# Patient Record
Sex: Female | Born: 1955
Health system: Southern US, Community
[De-identification: ages and names within clinical notes are randomized; demographics above are authoritative.]

## PROBLEM LIST (undated history)

## (undated) DIAGNOSIS — B019 Varicella without complication: Secondary | ICD-10-CM

## (undated) DIAGNOSIS — I10 Essential (primary) hypertension: Secondary | ICD-10-CM

## (undated) DIAGNOSIS — E785 Hyperlipidemia, unspecified: Secondary | ICD-10-CM

## (undated) DIAGNOSIS — K759 Inflammatory liver disease, unspecified: Secondary | ICD-10-CM

## (undated) HISTORY — DX: Varicella without complication: B01.9

## (undated) HISTORY — DX: Inflammatory liver disease, unspecified: K75.9

## (undated) HISTORY — PX: OTHER SURGICAL HISTORY: SHX169

## (undated) HISTORY — DX: Essential (primary) hypertension: I10

## (undated) HISTORY — DX: Hyperlipidemia, unspecified: E78.5

---

## 1988-12-20 HISTORY — PX: CHOLECYSTECTOMY: SHX55

## 1988-12-20 HISTORY — PX: ABDOMINAL HYSTERECTOMY: SHX81

## 1994-12-20 HISTORY — PX: BREAST REDUCTION SURGERY: SHX8

## 1998-07-22 ENCOUNTER — Inpatient Hospital Stay (HOSPITAL_COMMUNITY): Admission: RE | Admit: 1998-07-22 | Discharge: 1998-07-24 | Payer: Self-pay | Admitting: Obstetrics and Gynecology

## 1999-08-26 ENCOUNTER — Other Ambulatory Visit: Admission: RE | Admit: 1999-08-26 | Discharge: 1999-08-26 | Payer: Self-pay | Admitting: Obstetrics and Gynecology

## 1999-09-29 ENCOUNTER — Encounter: Admission: RE | Admit: 1999-09-29 | Discharge: 1999-09-29 | Payer: Self-pay | Admitting: Obstetrics and Gynecology

## 2000-09-21 ENCOUNTER — Other Ambulatory Visit: Admission: RE | Admit: 2000-09-21 | Discharge: 2000-09-21 | Payer: Self-pay | Admitting: Obstetrics and Gynecology

## 2000-09-23 ENCOUNTER — Encounter: Admission: RE | Admit: 2000-09-23 | Discharge: 2000-09-23 | Payer: Self-pay | Admitting: Obstetrics and Gynecology

## 2000-09-23 ENCOUNTER — Encounter: Payer: Self-pay | Admitting: Obstetrics and Gynecology

## 2001-09-25 ENCOUNTER — Other Ambulatory Visit: Admission: RE | Admit: 2001-09-25 | Discharge: 2001-09-25 | Payer: Self-pay | Admitting: Obstetrics and Gynecology

## 2001-09-26 ENCOUNTER — Encounter: Admission: RE | Admit: 2001-09-26 | Discharge: 2001-09-26 | Payer: Self-pay | Admitting: Obstetrics and Gynecology

## 2001-09-26 ENCOUNTER — Encounter: Payer: Self-pay | Admitting: Obstetrics and Gynecology

## 2002-09-27 ENCOUNTER — Encounter: Admission: RE | Admit: 2002-09-27 | Discharge: 2002-09-27 | Payer: Self-pay | Admitting: Obstetrics and Gynecology

## 2002-09-27 ENCOUNTER — Encounter: Payer: Self-pay | Admitting: Obstetrics and Gynecology

## 2003-10-07 ENCOUNTER — Encounter: Admission: RE | Admit: 2003-10-07 | Discharge: 2003-10-07 | Payer: Self-pay | Admitting: Obstetrics and Gynecology

## 2003-10-07 ENCOUNTER — Encounter: Payer: Self-pay | Admitting: Obstetrics and Gynecology

## 2004-10-08 ENCOUNTER — Encounter: Admission: RE | Admit: 2004-10-08 | Discharge: 2004-10-08 | Payer: Self-pay | Admitting: Obstetrics and Gynecology

## 2005-07-30 ENCOUNTER — Encounter: Admission: RE | Admit: 2005-07-30 | Discharge: 2005-07-30 | Payer: Self-pay | Admitting: Obstetrics and Gynecology

## 2006-08-11 ENCOUNTER — Encounter: Admission: RE | Admit: 2006-08-11 | Discharge: 2006-08-11 | Payer: Self-pay | Admitting: Obstetrics and Gynecology

## 2007-08-25 ENCOUNTER — Encounter: Admission: RE | Admit: 2007-08-25 | Discharge: 2007-08-25 | Payer: Self-pay | Admitting: Obstetrics and Gynecology

## 2008-08-28 ENCOUNTER — Encounter: Admission: RE | Admit: 2008-08-28 | Discharge: 2008-08-28 | Payer: Self-pay | Admitting: Obstetrics and Gynecology

## 2009-08-29 ENCOUNTER — Encounter: Admission: RE | Admit: 2009-08-29 | Discharge: 2009-08-29 | Payer: Self-pay | Admitting: Obstetrics and Gynecology

## 2009-12-24 LAB — HM MAMMOGRAPHY: HM Mammogram: NEGATIVE

## 2010-09-03 ENCOUNTER — Encounter: Admission: RE | Admit: 2010-09-03 | Discharge: 2010-09-03 | Payer: Self-pay | Admitting: Obstetrics and Gynecology

## 2011-06-18 ENCOUNTER — Ambulatory Visit: Payer: Self-pay | Admitting: Family Medicine

## 2011-06-24 ENCOUNTER — Ambulatory Visit (INDEPENDENT_AMBULATORY_CARE_PROVIDER_SITE_OTHER): Payer: 59 | Admitting: Family Medicine

## 2011-06-24 ENCOUNTER — Encounter: Payer: Self-pay | Admitting: Family Medicine

## 2011-06-24 DIAGNOSIS — E785 Hyperlipidemia, unspecified: Secondary | ICD-10-CM | POA: Insufficient documentation

## 2011-06-24 DIAGNOSIS — E119 Type 2 diabetes mellitus without complications: Secondary | ICD-10-CM

## 2011-06-24 DIAGNOSIS — I10 Essential (primary) hypertension: Secondary | ICD-10-CM

## 2011-06-24 LAB — BASIC METABOLIC PANEL
BUN: 11 mg/dL (ref 6–23)
Calcium: 9.5 mg/dL (ref 8.4–10.5)
Creatinine, Ser: 0.6 mg/dL (ref 0.4–1.2)
GFR: 108.35 mL/min (ref >60.00)

## 2011-06-24 LAB — HEPATIC FUNCTION PANEL
ALT: 29 U/L (ref 0–35)
Alkaline Phosphatase: 77 U/L (ref 39–117)
Bilirubin, Direct: 0.1 mg/dL (ref 0.0–0.3)
Total Protein: 7.1 g/dL (ref 6.0–8.3)

## 2011-06-24 LAB — LIPID PANEL
Cholesterol: 190 mg/dL (ref 0–200)
LDL Cholesterol: 106 mg/dL — ABNORMAL HIGH (ref 0–99)

## 2011-06-24 NOTE — Progress Notes (Signed)
  Subjective:    Patient ID: Amanda Obrien, female    DOB: September 18, 1956, 55 y.o.   MRN: [id number]  HPI New patient to establish care. Chronic problems include type 2 diabetes, hypertension, hyperlipidemia. She has had diabetes since the 1990s.  Unknown level of control. Does not monitor blood sugars regularly. No symptoms of hyperglycemia. Last A1c over 6 months ago. Does receive yearly eye exams. Takes metformin 500 mg twice daily  Hyperlipidemia treated with simvastatin 20 mg daily. No history of CAD. No chest pain.  History hypertension treated Micardis-HCTZ.  No recent dizziness or orthostasis.  Surgical history cholecystectomy 1990s and hysterectomy 1990s. History breast reduction 1996  Family history significant for sister with breast cancer. Father had question of lymphoma and bladder cancer metastatic to lung. Mother with history of hyperlipidemia, hypertension, and type 2 diabetes.  Patient is married. Works as a Sports administrator. Nonsmoker. No alcohol use. She sees a gynecologist yearly for physical  Past Medical History  Diagnosis Date  . Chicken pox   . Hypertension   . Hyperlipidemia   . Hepatitis   . Diabetes mellitus 1998    type ll   Past Surgical History  Procedure Date  . Cholecystectomy 1990  . Abdominal hysterectomy 1990  . Breast reduction surgery 1996    reports that she has never smoked. She does not have any smokeless tobacco history on file. Her alcohol and drug histories not on file. family history includes Arthritis in her mother; Cancer in her sister; Cancer (age of onset:52) in her father; Diabetes in her maternal grandfather and mother; Hyperlipidemia in her mother; Hypertension in her mother; and Stroke in her maternal grandmother. No Known Allergies    Review of Systems  Constitutional: Negative for fever, chills, appetite change and fatigue.  HENT: Negative for trouble swallowing.   Eyes: Negative for visual disturbance.  Respiratory: Negative  for cough and shortness of breath.   Cardiovascular: Negative for chest pain, palpitations and leg swelling.  Gastrointestinal: Negative for abdominal pain and anal bleeding.  Genitourinary: Negative for dysuria.  Neurological: Negative for dizziness and weakness.  Hematological: Negative for adenopathy.  Psychiatric/Behavioral: Negative for dysphoric mood.       Objective:   Physical Exam  Constitutional: She is oriented to person, place, and time. She appears well-developed and well-nourished. No distress.  HENT:  Right Ear: External ear normal.  Left Ear: External ear normal.  Mouth/Throat: Oropharynx is clear and moist. No oropharyngeal exudate.  Neck: Neck supple. No thyromegaly present.  Cardiovascular: Normal rate, regular rhythm and normal heart sounds.   No murmur heard. Pulmonary/Chest: Effort normal and breath sounds normal. No respiratory distress. She has no wheezes. She has no rales.  Musculoskeletal: She exhibits no edema.  Lymphadenopathy:    She has no cervical adenopathy.  Neurological: She is alert and oriented to person, place, and time. No cranial nerve deficit.  Skin: No rash noted.  Psychiatric: She has a normal mood and affect. Her behavior is normal.          Assessment & Plan:  #1 type 2 diabetes. Reassess A1c. Continue yearly eye exam. #2 hyperlipidemia. Check lipids and hepatic panel  #3 hypertension stable continue current medication. Check basic metabolic panel. Discussed regular exercise

## 2011-06-25 ENCOUNTER — Other Ambulatory Visit: Payer: Self-pay | Admitting: *Deleted

## 2011-06-25 MED ORDER — SIMVASTATIN 20 MG PO TABS: 20.0000 mg | ORAL_TABLET | Freq: Every day | ORAL | Status: DC

## 2011-06-25 MED ORDER — TELMISARTAN-HCTZ 80-25 MG PO TABS: ORAL_TABLET | ORAL | Status: DC

## 2011-06-25 MED ORDER — METFORMIN HCL 1000 MG PO TABS: 500.0000 mg | ORAL_TABLET | Freq: Every day | ORAL | Status: DC

## 2011-06-25 NOTE — Progress Notes (Signed)
Quick Note:  Pt informed, she requested all her meds be refilled ______

## 2011-06-28 ENCOUNTER — Telehealth: Payer: Self-pay | Admitting: Family Medicine

## 2011-06-28 NOTE — Telephone Encounter (Signed)
Pt informed on VM this med was filled on 7/6 #90 with refills, so check with [facility]

## 2011-06-28 NOTE — Telephone Encounter (Signed)
On Metformin 1000mg  1qd. She only received 15 tabs form her pharmacy. She cuts those in half. So she is requesting 30 tablets per month. Please advise pt.

## 2011-07-09 ENCOUNTER — Telehealth: Payer: Self-pay | Admitting: Family Medicine

## 2011-07-09 NOTE — Telephone Encounter (Signed)
Amanda Obrien at pharmacy reports they did receive the refill on 7/6 #90 with 2 refills.  Perhaps Amanda Obrien insurance is the problem.  Informed pt on home phone Rx had been received at [facility]

## 2011-07-09 NOTE — Telephone Encounter (Signed)
Pt. Spoke with [facility], they did not receive prescription. Pt requesting to resend.     Braxton Feathers, LPN 12/25/1094 0:45 PM Signed  Pt informed on VM this med was filled on 7/6 #90 with refills, so check [facility] 06/28/2011 9:55 AM Signed  On Metformin 1000mg  1qd. She only received 15 tabs form her pharmacy. She cuts those in half. So she is requesting 30 tablets per month. Please advise pt.

## 2011-07-12 ENCOUNTER — Telehealth: Payer: Self-pay | Admitting: Family Medicine

## 2011-07-12 MED ORDER — METFORMIN HCL 1000 MG PO TABS: ORAL_TABLET | ORAL | Status: DC

## 2011-07-12 NOTE — Telephone Encounter (Signed)
Pt called and said that originally she was prescribed 1000 mg metformin and she was cutting pill in half and taking 500 mg in a.m and the other 500 mg at night. Pt is wondering if she now is suppose to be taking 500 mg once a day? Pls call to clarify and contact [facility] with dosage clarification.

## 2011-07-12 NOTE — Telephone Encounter (Signed)
Pt came to [place] as a new pt, has been taking metformin 1000 mg, taking 1/2 tab 2 times daily.   Correction made to chart and sent to pharmacy with new sig.  Pt aware

## 2011-08-26 ENCOUNTER — Other Ambulatory Visit: Payer: Self-pay | Admitting: Obstetrics and Gynecology

## 2011-08-26 DIAGNOSIS — Z1231 Encounter for screening mammogram for malignant neoplasm of breast: Secondary | ICD-10-CM

## 2011-09-16 ENCOUNTER — Ambulatory Visit
Admission: RE | Admit: 2011-09-16 | Discharge: 2011-09-16 | Disposition: A | Discharge status: Home / Self Care | Payer: 59 | Source: Ambulatory Visit | Attending: Obstetrics and Gynecology | Admitting: Obstetrics and Gynecology

## 2011-09-16 DIAGNOSIS — Z1231 Encounter for screening mammogram for malignant neoplasm of breast: Secondary | ICD-10-CM

## 2011-10-07 ENCOUNTER — Encounter: Payer: Self-pay | Admitting: Family Medicine

## 2011-10-07 ENCOUNTER — Ambulatory Visit (INDEPENDENT_AMBULATORY_CARE_PROVIDER_SITE_OTHER): Payer: 59 | Admitting: Family Medicine

## 2011-10-07 DIAGNOSIS — E119 Type 2 diabetes mellitus without complications: Secondary | ICD-10-CM

## 2011-10-07 DIAGNOSIS — I10 Essential (primary) hypertension: Secondary | ICD-10-CM

## 2011-10-07 MED ORDER — TELMISARTAN-HCTZ 40-12.5 MG PO TABS: 1.0000 | ORAL_TABLET | Freq: Every day | ORAL | Status: DC

## 2011-10-07 NOTE — Progress Notes (Signed)
  Subjective:    Patient ID: Amanda Obrien, female    DOB: [birth year], 55 y.o.   MRN: [id number]  HPI Patient seen for medical followup. Type 2 diabetes. Mostly below 100 blood sugars. Takes metformin. Has lost about 16 pounds with Weight Watchers since last visit. Feels better overall. Was having some dizziness and she reduced her blood pressure medication to one fourth of her depression medication. No orthostasis at this time. No hypoglycemia. Not exercising regularly.   Review of Systems  Constitutional: Negative for fever, chills, appetite change, fatigue and unexpected weight change.  Respiratory: Negative for cough and shortness of breath.   Cardiovascular: Negative for chest pain.  Neurological: Negative for dizziness and headaches.       Objective:   Physical Exam  Constitutional: She appears well-developed and well-nourished.  Neck: Neck supple. No thyromegaly present.  Cardiovascular: Normal rate and regular rhythm.   Pulmonary/Chest: Effort normal and breath sounds normal. No respiratory distress. She has no wheezes. She has no rales.  Musculoskeletal: She exhibits no edema.  Lymphadenopathy:    She has no cervical adenopathy.          Assessment & Plan:  #1 diabetes. Type II. History of good control. Recheck A1c. Good every 6 months if well controlled today #2 hypertension stable produce mICARDIS HCTZ to 40/12.5 one daily

## 2011-10-13 NOTE — Progress Notes (Signed)
Quick Note:  Pt informed on home VM ______ 

## 2012-01-13 LAB — HM DIABETES EYE EXAM: HM Diabetic Eye Exam: NORMAL

## 2012-02-14 ENCOUNTER — Ambulatory Visit (INDEPENDENT_AMBULATORY_CARE_PROVIDER_SITE_OTHER): Payer: 59 | Admitting: Family Medicine

## 2012-02-14 ENCOUNTER — Encounter: Payer: Self-pay | Admitting: Family Medicine

## 2012-02-14 DIAGNOSIS — J019 Acute sinusitis, unspecified: Secondary | ICD-10-CM

## 2012-02-14 MED ORDER — AMOXICILLIN 875 MG PO TABS: 875.0000 mg | ORAL_TABLET | Freq: Two times a day (BID) | ORAL | Status: AC

## 2012-02-14 NOTE — Patient Instructions (Signed)

## 2012-02-14 NOTE — Progress Notes (Signed)
  Subjective:    Patient ID: Amanda Obrien, female    DOB: Jul 15, 1956, 56 y.o.   MRN: [id number]  HPI  Acute visit. Of one week history of progressive maxillary facial pain right greater than left. She's had some colored nasal discharge. Increased fatigue. Mild body aches. Intermittent headaches. Intermittent sore throat. Minimal cough. She's tried increase fluid and nasal irrigation along with over-the-counter decongestants without any improvement.   Review of Systems  Constitutional: Positive for fatigue. Negative for fever and chills.  HENT: Positive for congestion, sore throat, postnasal drip and sinus pressure.   Respiratory: Negative for cough.   Neurological: Positive for headaches.       Objective:   Physical Exam  Constitutional: She appears well-developed and well-nourished.  HENT:  Right Ear: External ear normal.  Left Ear: External ear normal.  Mouth/Throat: Oropharynx is clear and moist.  Neck: Neck supple.  Cardiovascular: Normal rate and regular rhythm.   Pulmonary/Chest: Effort normal and breath sounds normal. No respiratory distress. She has no wheezes. She has no rales.  Lymphadenopathy:    She has no cervical adenopathy.          Assessment & Plan:  Acute sinusitis. Question viral. Recommend observe for another couple days. If symptoms persist or worse in the meantime start amoxicillin 875 mg twice daily for 10 days.

## 2012-02-17 ENCOUNTER — Encounter: Payer: Self-pay | Admitting: Family Medicine

## 2012-03-27 ENCOUNTER — Other Ambulatory Visit: Payer: Self-pay | Admitting: Family Medicine

## 2012-04-06 ENCOUNTER — Ambulatory Visit (INDEPENDENT_AMBULATORY_CARE_PROVIDER_SITE_OTHER): Payer: 59 | Admitting: Family Medicine

## 2012-04-06 ENCOUNTER — Encounter: Payer: Self-pay | Admitting: Family Medicine

## 2012-04-06 VITALS — BP 110/80 | Temp 97.6°F | Wt 167.0 lb

## 2012-04-06 DIAGNOSIS — E119 Type 2 diabetes mellitus without complications: Secondary | ICD-10-CM

## 2012-04-06 DIAGNOSIS — Z0000 Encounter for general adult medical examination without abnormal findings: Secondary | ICD-10-CM

## 2012-04-06 DIAGNOSIS — E785 Hyperlipidemia, unspecified: Secondary | ICD-10-CM

## 2012-04-06 DIAGNOSIS — I10 Essential (primary) hypertension: Secondary | ICD-10-CM

## 2012-04-06 LAB — LIPID PANEL
HDL: 49.3 mg/dL (ref >39.00)
LDL Cholesterol: 85 mg/dL (ref 0–99)
Total CHOL/HDL Ratio: 3
Triglycerides: 127 mg/dL (ref 0.0–149.0)

## 2012-04-06 LAB — HEPATIC FUNCTION PANEL
Albumin: 4.5 g/dL (ref 3.5–5.2)
Alkaline Phosphatase: 73 U/L (ref 39–117)

## 2012-04-06 LAB — BASIC METABOLIC PANEL
CO2: 27 mEq/L (ref 19–32)
Chloride: 104 mEq/L (ref 96–112)
Creatinine, Ser: 0.6 mg/dL (ref 0.4–1.2)

## 2012-04-06 LAB — HEMOGLOBIN A1C: Hgb A1c MFr Bld: 6.2 % (ref 4.6–6.5)

## 2012-04-06 MED ORDER — METFORMIN HCL 1000 MG PO TABS: ORAL_TABLET | ORAL | Status: DC

## 2012-04-06 MED ORDER — TELMISARTAN-HCTZ 40-12.5 MG PO TABS: ORAL_TABLET | ORAL | Status: DC

## 2012-04-06 MED ORDER — SIMVASTATIN 20 MG PO TABS: 20.0000 mg | ORAL_TABLET | Freq: Every day | ORAL | Status: DC

## 2012-04-06 NOTE — Progress Notes (Signed)
  Subjective:    Patient ID: Amanda Obrien, female    DOB: [birth year], 56 y.o.   MRN: [id number]  HPI  Medical followup. Patient has history of type 2 diabetes, hypertension, and hyperlipidemia. Recent poor compliance with diet. She has gained back some weight since last visit. Last A1c 5.8%. Takes metformin 1000 mg one half tablet twice daily. Hypertension treated with Micardis- HCTZ. Hyperlipidemia treated with Zocor.  Compliant with medications. No side effects. No regular exercise. Receives yearly eye exams. No neuropathy symptoms. Patient is nonsmoker.  Past Medical History  Diagnosis Date  . Chicken pox   . Hypertension   . Hyperlipidemia   . Hepatitis   . Diabetes mellitus 1998    type ll   Past Surgical History  Procedure Date  . Cholecystectomy 1990  . Abdominal hysterectomy 1990  . Breast reduction surgery 1996    reports that she has never smoked. She does not have any smokeless tobacco history on file. Her alcohol and drug histories not on file. family history includes Arthritis in her mother; Cancer in her sister; Cancer (age of onset:52) in her father; Diabetes in her maternal grandfather and mother; Hyperlipidemia in her mother; Hypertension in her mother; and Stroke in her maternal grandmother. No Known Allergies    Review of Systems  Constitutional: Negative for fatigue.  Eyes: Negative for visual disturbance.  Respiratory: Negative for cough, chest tightness, shortness of breath and wheezing.   Cardiovascular: Negative for chest pain, palpitations and leg swelling.  Neurological: Negative for dizziness, seizures, syncope, weakness, light-headedness and headaches.       Objective:   Physical Exam  Constitutional: She appears well-developed and well-nourished.  Neck: Neck supple. No thyromegaly present.  Cardiovascular: Normal rate and regular rhythm.   Pulmonary/Chest: Effort normal and breath sounds normal. No respiratory distress. She has no wheezes. She  has no rales.  Musculoskeletal: She exhibits no edema.       Feet reveal no skin lesions. Good distal foot pulses. Good capillary refill. No calluses. Normal sensation with monofilament testing   Lymphadenopathy:    She has no cervical adenopathy.  Psychiatric: She has a normal mood and affect. Her behavior is normal.          Assessment & Plan:  #1 hypertension. Stable. Check basic metabolic panel. Work on weight loss #2 type 2 diabetes. Recheck A1c. Encouraged regular exercise and weight loss  #3 hyperlipidemia. Stable on simvastatin. Recheck lipids and hepatic panel #4 health maintenance. Patient has never had screening colonoscopy. She agrees at this time. She is asymptomatic. No family history of colon cancer.

## 2012-04-07 NOTE — Progress Notes (Signed)
Quick Note:  Pt informed on home VM ______ 

## 2012-04-10 ENCOUNTER — Encounter: Payer: Self-pay | Admitting: Internal Medicine

## 2012-05-19 ENCOUNTER — Encounter: Payer: Self-pay | Admitting: Internal Medicine

## 2012-05-19 ENCOUNTER — Ambulatory Visit (AMBULATORY_SURGERY_CENTER): Payer: 59 | Admitting: *Deleted

## 2012-05-19 VITALS — Ht 64.0 in | Wt 168.7 lb

## 2012-05-19 DIAGNOSIS — Z1211 Encounter for screening for malignant neoplasm of colon: Secondary | ICD-10-CM

## 2012-05-19 MED ORDER — PEG-KCL-NACL-NASULF-NA ASC-C 100 G PO SOLR: ORAL | Status: DC

## 2012-06-02 ENCOUNTER — Ambulatory Visit (AMBULATORY_SURGERY_CENTER): Payer: 59 | Admitting: Internal Medicine

## 2012-06-02 ENCOUNTER — Encounter: Payer: Self-pay | Admitting: Internal Medicine

## 2012-06-02 VITALS — BP 140/105 | HR 92 | Temp 98.7°F | Resp 18 | Ht 64.0 in | Wt 168.0 lb

## 2012-06-02 DIAGNOSIS — Z1211 Encounter for screening for malignant neoplasm of colon: Secondary | ICD-10-CM

## 2012-06-02 MED ORDER — SODIUM CHLORIDE 0.9 % IV SOLN: 500.0000 mL | INTRAVENOUS | Status: DC

## 2012-06-02 NOTE — Progress Notes (Signed)
Patient did not experience any of the following events: a burn prior to discharge; a fall within the facility; wrong site/side/patient/procedure/implant event; or a hospital transfer or hospital admission upon discharge from the facility. (G8907) Patient did not have preoperative order for IV antibiotic SSI prophylaxis. (G8918)  

## 2012-06-02 NOTE — Op Note (Signed)
[facility] [address] [place], [place]  [address]  COLONOSCOPY PROCEDURE REPORT  PATIENT:  Amanda Obrien, Amanda Obrien  MR#:  [id number] BIRTHDATE:  1956-03-10, 55 yrs. old  GENDER:  female ENDOSCOPIST:  Hedwig Morton. Juanda Chance, MD REF. BY:  Evelena Peat, M.D. PROCEDURE DATE:  06/02/2012 PROCEDURE:  Colonoscopy 19147 ASA CLASS:  Class II INDICATIONS:  colorectal cancer screening, average risk MEDICATIONS:   MAC sedation, administered by CRNA, propofol (Diprivan) 200 mg  DESCRIPTION OF PROCEDURE:   After the risks and benefits and of the procedure were explained, informed consent was obtained. Digital rectal exam was performed and revealed no rectal masses. The LB CF-[id number] endoscope was introduced through the anus and advanced to the cecum, which was identified by both the appendix and ileocecal valve.  The quality of the prep was good, using MoviPrep.  The instrument was then slowly withdrawn as the colon was fully examined. <<PROCEDUREIMAGES>>  FINDINGS:  No polyps or cancers were seen (see image3, image4, image5, image6, and image7).  Scattered diverticula were found (see image1).   Retroflexed views in the rectum revealed no abnormalities.    The scope was then withdrawn from the patient and the procedure completed.  COMPLICATIONS:  None ENDOSCOPIC IMPRESSION: 1) No polyps or cancers 2) Diverticula, scattered RECOMMENDATIONS: 1) High fiber diet.  REPEAT EXAM:  In 10 year(s) for.  ______________________________ Hedwig Morton. Juanda Chance, MD  CC:  n. eSIGNED:   Hedwig Morton. Mercedees Convery at 06/02/2012 10:09 AM  Huey Bienenstock, [phone]

## 2012-06-02 NOTE — Patient Instructions (Addendum)

## 2012-06-05 ENCOUNTER — Telehealth: Payer: Self-pay | Admitting: *Deleted

## 2012-06-05 NOTE — Telephone Encounter (Signed)
  Follow up Call-  Call back number 06/02/2012  Post procedure Call Back phone  # [phone]  Permission to leave phone message Yes     Patient questions:  Do you have a fever, pain , or abdominal swelling? no Pain Score  0 *  Have you tolerated food without any problems? yes  Have you been able to return to your normal activities? yes  Do you have any questions about your discharge instructions: Diet   no Medications  no Follow up visit  no  Do you have questions or concerns about your Care? no  Actions: * If pain score is 4 or above: No action needed, pain <4.

## 2012-08-24 ENCOUNTER — Other Ambulatory Visit: Payer: Self-pay | Admitting: Obstetrics and Gynecology

## 2012-08-24 DIAGNOSIS — Z1231 Encounter for screening mammogram for malignant neoplasm of breast: Secondary | ICD-10-CM

## 2012-09-21 ENCOUNTER — Ambulatory Visit: Payer: 59

## 2012-09-28 ENCOUNTER — Ambulatory Visit
Admission: RE | Admit: 2012-09-28 | Discharge: 2012-09-28 | Disposition: A | Discharge status: Home / Self Care | Payer: 59 | Source: Ambulatory Visit | Attending: Obstetrics and Gynecology | Admitting: Obstetrics and Gynecology

## 2012-09-28 DIAGNOSIS — Z1231 Encounter for screening mammogram for malignant neoplasm of breast: Secondary | ICD-10-CM

## 2012-10-06 ENCOUNTER — Ambulatory Visit: Payer: 59 | Admitting: Family Medicine

## 2012-10-12 ENCOUNTER — Ambulatory Visit (INDEPENDENT_AMBULATORY_CARE_PROVIDER_SITE_OTHER): Payer: 59 | Admitting: Family Medicine

## 2012-10-12 ENCOUNTER — Encounter: Payer: Self-pay | Admitting: Family Medicine

## 2012-10-12 ENCOUNTER — Telehealth: Payer: Self-pay | Admitting: *Deleted

## 2012-10-12 VITALS — BP 142/80 | Temp 97.6°F | Wt 176.0 lb

## 2012-10-12 DIAGNOSIS — E119 Type 2 diabetes mellitus without complications: Secondary | ICD-10-CM

## 2012-10-12 DIAGNOSIS — Z23 Encounter for immunization: Secondary | ICD-10-CM

## 2012-10-12 DIAGNOSIS — I10 Essential (primary) hypertension: Secondary | ICD-10-CM

## 2012-10-12 LAB — HEMOGLOBIN A1C: Hgb A1c MFr Bld: 6.6 % — ABNORMAL HIGH (ref 4.6–6.5)

## 2012-10-12 NOTE — Progress Notes (Signed)
  Subjective:    Patient ID: Amanda Obrien, female    DOB: August 07, 1956, 56 y.o.   MRN: [id number]  HPI  Followup for diabetes, hypertension, and hyperlipidemia. She's under stress of mother with terminal illness with leukemia. Poor dietary compliance. Not exercising. Has gained 9 pounds since last visit. Not monitoring blood pressure regularly. Diabetes slightly elevated. Last A1c 6.2%. Remains on metformin 500 mg twice daily. Lipids well controlled last visit. No dizziness. No headaches. No chest pains. Nonsmoker.  Past Medical History  Diagnosis Date  . Chicken pox   . Hypertension   . Hyperlipidemia   . Hepatitis   . Diabetes mellitus 1998    type ll   Past Surgical History  Procedure Date  . Cholecystectomy 1990  . Abdominal hysterectomy 1990  . Breast reduction surgery 1996    reports that she has never smoked. She has never used smokeless tobacco. She reports that she does not drink alcohol or use illicit drugs. family history includes Arthritis in her mother; Cancer in her sister; Cancer (age of onset:52) in her father; Diabetes in her maternal grandfather and mother; Hyperlipidemia in her mother; Hypertension in her mother; and Stroke in her maternal grandmother.  There is no history of Colon cancer and Stomach cancer. No Known Allergies   Review of Systems  Constitutional: Negative for chills, appetite change and unexpected weight change.  Respiratory: Negative for cough and shortness of breath.   Cardiovascular: Negative for chest pain.  Neurological: Negative for dizziness and syncope.       Objective:   Physical Exam  Constitutional: She is oriented to person, place, and time. She appears well-developed and well-nourished. No distress.  Neck: Neck supple. No thyromegaly present.  Cardiovascular: Normal rate and regular rhythm.   Pulmonary/Chest: Effort normal and breath sounds normal. No respiratory distress. She has no wheezes. She has no rales.  Musculoskeletal:  She exhibits no edema.  Lymphadenopathy:    She has no cervical adenopathy.  Neurological: She is alert and oriented to person, place, and time.          Assessment & Plan:  #1Type 2 diabetes. Prior history of good control but recent poor compliance and suspect A1c will be up somewhat.  Discussed diet and exercise. Recheck A1c. She has eye exam scheduled for December #2 hypertension. Slightly elevated today. Work on weight loss and be in touch if not consistently less than 140/90 by home readings  #3 health maintenance. Flu vaccine given

## 2012-10-12 NOTE — Patient Instructions (Signed)
Work on losing some weight and monitor blood pressure and be in touch if consistently > 140/90.

## 2012-10-13 ENCOUNTER — Other Ambulatory Visit: Payer: Self-pay | Admitting: *Deleted

## 2012-10-13 MED ORDER — TELMISARTAN-HCTZ 40-12.5 MG PO TABS: ORAL_TABLET | ORAL | Status: DC

## 2012-10-13 MED ORDER — METFORMIN HCL 1000 MG PO TABS: ORAL_TABLET | ORAL | Status: DC

## 2012-10-13 MED ORDER — SIMVASTATIN 20 MG PO TABS: 20.0000 mg | ORAL_TABLET | Freq: Every day | ORAL | Status: DC

## 2012-10-13 NOTE — Progress Notes (Signed)
Quick Note:  Pt informed ______ 

## 2012-10-16 NOTE — Telephone Encounter (Signed)
Refill ordered

## 2012-11-04 ENCOUNTER — Other Ambulatory Visit: Payer: Self-pay | Admitting: Family Medicine

## 2013-01-11 LAB — HM DIABETES EYE EXAM

## 2013-01-25 ENCOUNTER — Encounter: Payer: Self-pay | Admitting: Family Medicine

## 2013-02-08 ENCOUNTER — Other Ambulatory Visit: Payer: Self-pay | Admitting: Family Medicine

## 2013-04-03 ENCOUNTER — Encounter: Payer: Self-pay | Admitting: Family Medicine

## 2013-04-03 ENCOUNTER — Ambulatory Visit (INDEPENDENT_AMBULATORY_CARE_PROVIDER_SITE_OTHER): Payer: 59 | Admitting: Family Medicine

## 2013-04-03 VITALS — BP 120/70 | Temp 98.4°F | Wt 174.0 lb

## 2013-04-03 DIAGNOSIS — M545 Low back pain, unspecified: Secondary | ICD-10-CM

## 2013-04-03 DIAGNOSIS — R319 Hematuria, unspecified: Secondary | ICD-10-CM

## 2013-04-03 LAB — URINALYSIS, ROUTINE W REFLEX MICROSCOPIC
Ketones, ur: NEGATIVE
Leukocytes, UA: NEGATIVE
Specific Gravity, Urine: <=1.005 (ref 1.000–1.030)
Urobilinogen, UA: 0.2 (ref 0.0–1.0)
pH: 5.5 (ref 5.0–8.0)

## 2013-04-03 LAB — POCT URINALYSIS DIPSTICK
Ketones, UA: NEGATIVE
Protein, UA: NEGATIVE
Spec Grav, UA: <=1.005
Urobilinogen, UA: 0.2
pH, UA: >=9

## 2013-04-03 MED ORDER — MELOXICAM 15 MG PO TABS: 15.0000 mg | ORAL_TABLET | Freq: Every day | ORAL | Status: DC

## 2013-04-03 NOTE — Progress Notes (Deleted)
  Subjective:    Patient ID: Amanda Obrien, female    DOB: [birth year], 57 y.o.   MRN: [id number]  HPI    Review of Systems     Objective:   Physical Exam        Assessment & Plan:

## 2013-04-03 NOTE — Progress Notes (Signed)
  Subjective:    Patient ID: Amanda Obrien, female    DOB: September 08, 1956, 57 y.o.   MRN: [id number]  HPI Acute visit Dull, achy pain left lower lumbar to left flank. Sunday (2 days ago) started. NKI.  Tylenol helps.  Pain at rest. No urinary symptoms.  No fever or chills. Ice with mild relief.  No radiculopathy pain.  Severity of pain 6/10.    No gross hematuria. No history of kidney stones. She has type 2 diabetes which is been fairly well controlled.  Past Medical History  Diagnosis Date  . Chicken pox   . Hypertension   . Hyperlipidemia   . Hepatitis   . Diabetes mellitus 1998    type ll   Past Surgical History  Procedure Laterality Date  . Cholecystectomy  1990  . Abdominal hysterectomy  1990  . Breast reduction surgery  1996    reports that she has never smoked. She has never used smokeless tobacco. She reports that she does not drink alcohol or use illicit drugs. family history includes Arthritis in her mother; Cancer in her sister; Cancer (age of onset: 76) in her father; Diabetes in her maternal grandfather and mother; Hyperlipidemia in her mother; Hypertension in her mother; and Stroke in her maternal grandmother.  There is no history of Colon cancer and Stomach cancer. No Known Allergies    Review of Systems  Constitutional: Negative for chills, appetite change and unexpected weight change.  Respiratory: Negative for cough and shortness of breath.   Cardiovascular: Negative for chest pain.  Gastrointestinal: Negative for nausea, vomiting, abdominal pain and blood in stool.  Genitourinary: Negative for dysuria and hematuria.  Musculoskeletal: Positive for back pain.  Skin: Negative for rash.  Hematological: Negative for adenopathy.       Objective:   Physical Exam  Constitutional: She appears well-developed and well-nourished. No distress.  Cardiovascular: Normal rate and regular rhythm.   Pulmonary/Chest: Effort normal and breath sounds normal. No  respiratory distress. She has no wheezes. She has no rales.  Abdominal: Soft. Bowel sounds are normal. She exhibits no distension and no mass. There is no tenderness. There is no rebound and no guarding.  Musculoskeletal:  Straight leg raises are negative. Patient has minimal tenderness left flank region. No skin rash. No visible swelling.  Skin: No rash noted.          Assessment & Plan:  Left lower back pain/flank pain. Urine dipstick 1+ blood otherwise negative. Send urine for micro-evaluation. Suspect this is musculoskeletal. No signs of infection. Doubt kidney stones. Meloxicam 15 mg once daily. She has followup in 2 weeks and reassess then

## 2013-04-03 NOTE — Patient Instructions (Addendum)
Follow up promptly for any fever, gross hematuria, worsening pain.

## 2013-04-05 NOTE — Progress Notes (Signed)
Quick Note:  Pt informed ______ 

## 2013-04-12 ENCOUNTER — Ambulatory Visit (INDEPENDENT_AMBULATORY_CARE_PROVIDER_SITE_OTHER): Payer: 59 | Admitting: Family Medicine

## 2013-04-12 ENCOUNTER — Encounter: Payer: Self-pay | Admitting: Family Medicine

## 2013-04-12 VITALS — BP 140/82 | Temp 97.7°F | Wt 172.0 lb

## 2013-04-12 DIAGNOSIS — E119 Type 2 diabetes mellitus without complications: Secondary | ICD-10-CM

## 2013-04-12 DIAGNOSIS — E785 Hyperlipidemia, unspecified: Secondary | ICD-10-CM

## 2013-04-12 DIAGNOSIS — I10 Essential (primary) hypertension: Secondary | ICD-10-CM

## 2013-04-12 LAB — BASIC METABOLIC PANEL
BUN: 15 mg/dL (ref 6–23)
CO2: 30 mEq/L (ref 19–32)
Chloride: 100 mEq/L (ref 96–112)
Creatinine, Ser: 0.8 mg/dL (ref 0.4–1.2)
Glucose, Bld: 127 mg/dL — ABNORMAL HIGH (ref 70–99)
Potassium: 4.1 mEq/L (ref 3.5–5.1)

## 2013-04-12 LAB — HEPATIC FUNCTION PANEL
ALT: 27 U/L (ref 0–35)
AST: 17 U/L (ref 0–37)
Albumin: 3.8 g/dL (ref 3.5–5.2)
Total Bilirubin: 0.2 mg/dL — ABNORMAL LOW (ref 0.3–1.2)
Total Protein: 7.4 g/dL (ref 6.0–8.3)

## 2013-04-12 LAB — LIPID PANEL: Cholesterol: 182 mg/dL (ref 0–200)

## 2013-04-12 NOTE — Progress Notes (Signed)
  Subjective:    Patient ID: Amanda Obrien, female    DOB: April 01, 1956, 57 y.o.   MRN: [id number]  HPI Medical followup. Back pain from last visit has resolved. She has type 2 diabetes, hypertension, and hyperlipidemia. Hypertension has generally been well controlled. Denies any dizziness or headaches.  Type 2 diabetes. Last hemoglobin A1c 6.6%. Not checking blood sugars. No recent symptoms of hyperglycemia. Recent eye exam in January normal. No neuropathy symptoms  Hyperlipidemia treated with simvastatin. Needs followup lab work. No myalgias. No recent chest pains. Recent poor compliance with diet and not exercising  Past Medical History  Diagnosis Date  . Chicken pox   . Hypertension   . Hyperlipidemia   . Hepatitis   . Diabetes mellitus 1998    type ll   Past Surgical History  Procedure Laterality Date  . Cholecystectomy  1990  . Abdominal hysterectomy  1990  . Breast reduction surgery  1996    reports that she has never smoked. She has never used smokeless tobacco. She reports that she does not drink alcohol or use illicit drugs. family history includes Arthritis in her mother; Cancer in her sister; Cancer (age of onset: 34) in her father; Diabetes in her maternal grandfather and mother; Hyperlipidemia in her mother; Hypertension in her mother; and Stroke in her maternal grandmother.  There is no history of Colon cancer and Stomach cancer. No Known Allergies    Review of Systems  Constitutional: Negative for appetite change and unexpected weight change.  Eyes: Negative for visual disturbance.  Respiratory: Negative for cough, chest tightness, shortness of breath and wheezing.   Cardiovascular: Negative for chest pain, palpitations and leg swelling.  Endocrine: Negative for polydipsia and polyuria.  Genitourinary: Negative for dysuria and hematuria.  Neurological: Negative for dizziness, seizures, syncope, weakness, light-headedness and headaches.       Objective:    Physical Exam  Constitutional: She appears well-developed and well-nourished. No distress.  Neck: Neck supple. No thyromegaly present.  Cardiovascular: Normal rate and regular rhythm.   Pulmonary/Chest: Effort normal and breath sounds normal. No respiratory distress. She has no wheezes. She has no rales.  Musculoskeletal: She exhibits no edema.  Skin:  Feet reveal no skin lesions. Good distal foot pulses. Good capillary refill. No calluses. Normal sensation with monofilament testing           Assessment & Plan:  #1 type 2 diabetes. History of good control. Recheck A1c. Work on weight loss and establishing more consistent exercise #2 hypertension. Slightly elevated today. Weight loss and exercise and close home monitoring. Adjust dosage of current blood pressure medication if still running elevated next couple months #3 hyperlipidemia. Recheck lipid and hepatic panel

## 2013-04-13 NOTE — Progress Notes (Signed)
Quick Note:  Pt informed ______ 

## 2013-07-14 ENCOUNTER — Other Ambulatory Visit: Payer: Self-pay | Admitting: Family Medicine

## 2013-07-31 ENCOUNTER — Encounter: Payer: Self-pay | Admitting: Family Medicine

## 2013-07-31 ENCOUNTER — Ambulatory Visit (INDEPENDENT_AMBULATORY_CARE_PROVIDER_SITE_OTHER): Payer: 59 | Admitting: Family Medicine

## 2013-07-31 VITALS — BP 140/82 | Temp 98.5°F | Wt 168.0 lb

## 2013-07-31 DIAGNOSIS — I10 Essential (primary) hypertension: Secondary | ICD-10-CM

## 2013-07-31 DIAGNOSIS — K529 Noninfective gastroenteritis and colitis, unspecified: Secondary | ICD-10-CM

## 2013-07-31 DIAGNOSIS — K5289 Other specified noninfective gastroenteritis and colitis: Secondary | ICD-10-CM

## 2013-07-31 MED ORDER — ONDANSETRON HCL 4 MG PO TABS: 4.0000 mg | ORAL_TABLET | Freq: Three times a day (TID) | ORAL | Status: DC | PRN

## 2013-07-31 NOTE — Patient Instructions (Signed)
-  plenty of fluids - soup broth is good  -stop metformin while not eating if sugars ok  -loperamide if needed per instructions  -zofran as needed  -follow up with Dr. Caryl Never to recheck BP in 2-4 weeks or needed

## 2013-07-31 NOTE — Progress Notes (Signed)
Chief Complaint  Patient presents with  . Headache    nauea, hotflashes, diarrhea, sensitve to light     HPI:  Amanda Obrien is a 51 o F patient of Dr. Caryl Never here for an acute visit for nausea: -started a few days ago, feeling a little better today but still had episode of diarrhea today -nausea, vomiting, diarrhea for last few day, mild HA on and off with, hot and cold, cramping intermittent abd pain yesterday, L ear pressure -denies: fevers, chills, blood in stool, SOB, tick bite -sick contacts - unknown -blood sugars are fine   ROS: See pertinent positives and negatives per HPI.  Past Medical History  Diagnosis Date  . Chicken pox   . Hypertension   . Hyperlipidemia   . Hepatitis   . Diabetes mellitus 1998    type ll    Family History  Problem Relation Age of Onset  . Arthritis Mother   . Hyperlipidemia Mother   . Hypertension Mother   . Diabetes Mother     type ll  . Cancer Father 54    bladder and lymphoma  . Cancer Sister   . Stroke Maternal Grandmother   . Diabetes Maternal Grandfather     type ll  . Colon cancer Neg Hx   . Stomach cancer Neg Hx     History   Social History  . Marital Status: Married    Spouse Name: N/A    Number of Children: N/A  . Years of Education: N/A   Social History Main Topics  . Smoking status: Never Smoker   . Smokeless tobacco: Never Used  . Alcohol Use: No  . Drug Use: No  . Sexually Active: None   Other Topics Concern  . None   Social History Narrative  . None    Current outpatient prescriptions:calcium carbonate (OS-CAL) 600 MG TABS, Take 600 mg by mouth 2 (two) times daily with a meal., Disp: , Rfl: ;  cholecalciferol (VITAMIN D) 1000 UNITS tablet, Take 1,000 Units by mouth daily., Disp: , Rfl: ;  meloxicam (MOBIC) 15 MG tablet, Take 15 mg by mouth as needed., Disp: , Rfl: ;  metFORMIN (GLUCOPHAGE) 1000 MG tablet, TAKE 1/2 TABLET BY MOUTH TWICE A DAY, Disp: 30 tablet, Rfl: 6 Multiple Vitamin (MULTIVITAMIN)  tablet, Take 1 tablet by mouth daily., Disp: , Rfl: ;  simvastatin (ZOCOR) 20 MG tablet, Take 1 tablet (20 mg total) by mouth at bedtime., Disp: 30 tablet, Rfl: 11;  telmisartan-hydrochlorothiazide (MICARDIS HCT) 40-12.5 MG per tablet, 1/2 tab daily, Disp: 30 tablet, Rfl: 6;  ondansetron (ZOFRAN) 4 MG tablet, Take 1 tablet (4 mg total) by mouth every 8 (eight) hours as needed for nausea., Disp: 15 tablet, Rfl: 0  EXAM:  Filed Vitals:   07/31/13 1347  BP: 140/82  Temp: 98.5 F (36.9 C)    Body mass index is 28.82 kg/(m^2).  GENERAL: vitals reviewed and listed above, alert, oriented, appears well hydrated and in no acute distress  HEENT: atraumatic, conjunttiva clear, no obvious abnormalities on inspection of external nose and ears, normal appearance of ear canals and TMs, clear nasal congestion, mild post oropharyngeal erythema with PND, no tonsillar edema or exudate, no sinus TTP  NECK: no obvious masses on inspection  LUNGS: clear to auscultation bilaterally, no wheezes, rales or rhonchi, good air movement  ABD: increased BS, soft, NTTP, no rebound or guarding  CV: HRRR, no peripheral edema  MS: moves all extremities without noticeable abnormality  PSYCH: pleasant  and cooperative, no obvious depression or anxiety  ASSESSMENT AND PLAN:  Discussed the following assessment and plan:  Gastroenteritis - Plan: ondansetron (ZOFRAN) 4 MG tablet  Hypertension  -symptoms c/w with likely viral gastroenteritis - advised supportive care and return precautions -Recommendations per orders and instructions, risks and use of medications and return precautions discussed. -BP up a little initially - better on recheck after sitting - advised follow up to recheck in a few weeks with PCP  -Patient advised to return or notify a doctor immediately if symptoms worsen or persist or new concerns arise.  Patient Instructions  -plenty of fluids - soup broth is good  -stop metformin while not eating  if sugars ok  -loperamide if needed per instructions  -zofran as needed  -follow up with Dr. Caryl Never to recheck BP in 2-4 weeks or needed     Amanda Obrien, Dahlia Client R.

## 2013-08-08 ENCOUNTER — Emergency Department (HOSPITAL_BASED_OUTPATIENT_CLINIC_OR_DEPARTMENT_OTHER): Payer: 59

## 2013-08-08 ENCOUNTER — Encounter (HOSPITAL_BASED_OUTPATIENT_CLINIC_OR_DEPARTMENT_OTHER): Payer: Self-pay

## 2013-08-08 ENCOUNTER — Emergency Department (HOSPITAL_BASED_OUTPATIENT_CLINIC_OR_DEPARTMENT_OTHER)
Admission: EM | Admit: 2013-08-08 | Discharge: 2013-08-08 | Disposition: A | Discharge status: Home / Self Care | Payer: 59 | Attending: Emergency Medicine | Admitting: Emergency Medicine

## 2013-08-08 DIAGNOSIS — W102XXA Fall (on)(from) incline, initial encounter: Secondary | ICD-10-CM

## 2013-08-08 DIAGNOSIS — I10 Essential (primary) hypertension: Secondary | ICD-10-CM | POA: Insufficient documentation

## 2013-08-08 DIAGNOSIS — E119 Type 2 diabetes mellitus without complications: Secondary | ICD-10-CM | POA: Insufficient documentation

## 2013-08-08 DIAGNOSIS — S80812A Abrasion, left lower leg, initial encounter: Secondary | ICD-10-CM

## 2013-08-08 DIAGNOSIS — Z8719 Personal history of other diseases of the digestive system: Secondary | ICD-10-CM | POA: Insufficient documentation

## 2013-08-08 DIAGNOSIS — W108XXA Fall (on) (from) other stairs and steps, initial encounter: Secondary | ICD-10-CM | POA: Insufficient documentation

## 2013-08-08 DIAGNOSIS — Y939 Activity, unspecified: Secondary | ICD-10-CM | POA: Insufficient documentation

## 2013-08-08 DIAGNOSIS — Z8619 Personal history of other infectious and parasitic diseases: Secondary | ICD-10-CM | POA: Insufficient documentation

## 2013-08-08 DIAGNOSIS — E785 Hyperlipidemia, unspecified: Secondary | ICD-10-CM | POA: Insufficient documentation

## 2013-08-08 DIAGNOSIS — Y9289 Other specified places as the place of occurrence of the external cause: Secondary | ICD-10-CM | POA: Insufficient documentation

## 2013-08-08 DIAGNOSIS — IMO0002 Reserved for concepts with insufficient information to code with codable children: Secondary | ICD-10-CM | POA: Insufficient documentation

## 2013-08-08 DIAGNOSIS — Z79899 Other long term (current) drug therapy: Secondary | ICD-10-CM | POA: Insufficient documentation

## 2013-08-08 DIAGNOSIS — S80211A Abrasion, right knee, initial encounter: Secondary | ICD-10-CM

## 2013-08-08 MED ORDER — ACETAMINOPHEN 325 MG PO TABS
650.0000 mg | ORAL_TABLET | Freq: Once | ORAL | Status: AC
  Administered 2013-08-08: 650 mg via ORAL
  Filled 2013-08-08: qty 2

## 2013-08-08 NOTE — ED Notes (Signed)
Amanda Obrien going down 3 garage steps at home 30-45 minPTA-pain left upper arm, left tib/fib, left pinky toe and right knee

## 2013-08-08 NOTE — ED Provider Notes (Signed)
CSN: [id number]     Arrival date & time 08/08/13  2141 History     First MD Initiated Contact with Patient 08/08/13 2248     Chief Complaint  Patient presents with  . Fall   (Consider location/radiation/quality/duration/timing/severity/associated sxs/prior Treatment) HPI Comments: Patient fell down steps going from the crotch approximately 3 steps about one hour ago. She has an abrasion to the left lower leg at the right knee. She has some pain of the left fifth toe. She also complains of pain shooting from her left shoulder down to her left elbow that worsens with movement. She did not hit her head and has no headache or neck pain.  Patient is a 57 y.o. female presenting with fall. The history is provided by the patient.  Fall This is a new problem. The current episode started less than 1 hour ago. The problem occurs constantly. The problem has not changed since onset.Pertinent negatives include no chest pain, no abdominal pain, no headaches and no shortness of breath. The symptoms are aggravated by twisting. Nothing relieves the symptoms. She has tried nothing for the symptoms. The treatment provided no relief.    Past Medical History  Diagnosis Date  . Chicken pox   . Hypertension   . Hyperlipidemia   . Hepatitis   . Diabetes mellitus 1998    type ll   Past Surgical History  Procedure Laterality Date  . Cholecystectomy  1990  . Abdominal hysterectomy  1990  . Breast reduction surgery  1996   Family History  Problem Relation Age of Onset  . Arthritis Mother   . Hyperlipidemia Mother   . Hypertension Mother   . Diabetes Mother     type ll  . Cancer Father 75    bladder and lymphoma  . Cancer Sister   . Stroke Maternal Grandmother   . Diabetes Maternal Grandfather     type ll  . Colon cancer Neg Hx   . Stomach cancer Neg Hx    History  Substance Use Topics  . Smoking status: Never Smoker   . Smokeless tobacco: Never Used  . Alcohol Use: No   OB History   Grav  Para Term Preterm Abortions TAB SAB Ect Mult Living                 Review of Systems  Respiratory: Negative for shortness of breath.   Cardiovascular: Negative for chest pain.  Gastrointestinal: Negative for abdominal pain.  Neurological: Negative for headaches.  All other systems reviewed and are negative.    Allergies  Review of patient's allergies indicates no known allergies.  Home Medications   Current Outpatient Rx  Name  Route  Sig  Dispense  Refill  . calcium carbonate (OS-CAL) 600 MG TABS   Oral   Take 600 mg by mouth 2 (two) times daily with a meal.         . cholecalciferol (VITAMIN D) 1000 UNITS tablet   Oral   Take 1,000 Units by mouth daily.         . meloxicam (MOBIC) 15 MG tablet   Oral   Take 15 mg by mouth as needed.         . metFORMIN (GLUCOPHAGE) 1000 MG tablet      TAKE 1/2 TABLET BY MOUTH TWICE A DAY   30 tablet   6   . Multiple Vitamin (MULTIVITAMIN) tablet   Oral   Take 1 tablet by mouth daily.         Marland Kitchen  ondansetron (ZOFRAN) 4 MG tablet   Oral   Take 1 tablet (4 mg total) by mouth every 8 (eight) hours as needed for nausea.   15 tablet   0   . simvastatin (ZOCOR) 20 MG tablet   Oral   Take 1 tablet (20 mg total) by mouth at bedtime.   30 tablet   11   . telmisartan-hydrochlorothiazide (MICARDIS HCT) 40-12.5 MG per tablet      1/2 tab daily   30 tablet   6    BP 161/67  Pulse 88  Temp(Src) 98.1 F (36.7 C) (Oral)  Resp 20  Ht 5\' 4"  (1.626 m)  Wt 165 lb (74.844 kg)  BMI 28.31 kg/m2  SpO2 98% Physical Exam  Nursing note and vitals reviewed. Constitutional: She appears well-developed and well-nourished.  HENT:  Head: Normocephalic and atraumatic.  Eyes: Conjunctivae and EOM are normal. Pupils are equal, round, and reactive to light.  Neck: Normal range of motion. Neck supple.  Cardiovascular: Normal rate, regular rhythm, normal heart sounds and intact distal pulses.   Pulmonary/Chest: Effort normal and breath  sounds normal.  Abdominal: Soft. Bowel sounds are normal.  Musculoskeletal: Normal range of motion.       Legs: Left shoulder with no tenderness to palpation over clavicle, humerus, scapula, elbow, forearm. She has full active range of motion of left wrist, elbow, shoulder. She has no tenderness palpation over her neck or thoracic spine. No abnormality noted in sensation and pulses are normal and left upper extremity.    ED Course   Procedures (including critical care time)  Labs Reviewed - No data to display No results found. No diagnosis found.  MDM    Hilario Quarry, MD 08/08/13 [phone]

## 2013-08-16 ENCOUNTER — Telehealth: Payer: Self-pay

## 2013-08-16 ENCOUNTER — Other Ambulatory Visit: Payer: Self-pay

## 2013-08-16 ENCOUNTER — Ambulatory Visit (INDEPENDENT_AMBULATORY_CARE_PROVIDER_SITE_OTHER): Payer: 59 | Admitting: Family Medicine

## 2013-08-16 VITALS — BP 136/84 | HR 82

## 2013-08-16 DIAGNOSIS — I10 Essential (primary) hypertension: Secondary | ICD-10-CM

## 2013-08-16 MED ORDER — GLUCOSE BLOOD VI STRP: ORAL_STRIP | Status: DC

## 2013-08-16 NOTE — Telephone Encounter (Signed)
Pt came in to get her bp check, when she was seen on 08/08/13 her bp was 161/67  Sitting 140/80 O2 98 Pulse 78 Standing 142/82 O2 98 Pulse 105 Sitting 136/84 O2 97 Pulse 82  Please Advise: Pt stated she feels fine

## 2013-08-16 NOTE — Telephone Encounter (Signed)
Okay to observe for now. Be in touch if consistently over 140/90

## 2013-08-16 NOTE — Telephone Encounter (Signed)
Left message on pt VM per Dr. Caryl Never

## 2013-08-30 ENCOUNTER — Other Ambulatory Visit: Payer: Self-pay

## 2013-08-30 DIAGNOSIS — Z1231 Encounter for screening mammogram for malignant neoplasm of breast: Secondary | ICD-10-CM

## 2013-10-11 ENCOUNTER — Ambulatory Visit
Admission: RE | Admit: 2013-10-11 | Discharge: 2013-10-11 | Disposition: A | Discharge status: Home / Self Care | Payer: 59 | Source: Ambulatory Visit

## 2013-10-11 DIAGNOSIS — Z1231 Encounter for screening mammogram for malignant neoplasm of breast: Secondary | ICD-10-CM

## 2013-10-12 ENCOUNTER — Ambulatory Visit: Payer: 59 | Admitting: Family Medicine

## 2013-10-26 ENCOUNTER — Ambulatory Visit (INDEPENDENT_AMBULATORY_CARE_PROVIDER_SITE_OTHER): Payer: 59 | Admitting: Family Medicine

## 2013-10-26 ENCOUNTER — Encounter: Payer: Self-pay | Admitting: Family Medicine

## 2013-10-26 VITALS — BP 130/80 | HR 84 | Temp 97.8°F | Wt 171.0 lb

## 2013-10-26 DIAGNOSIS — Z23 Encounter for immunization: Secondary | ICD-10-CM

## 2013-10-26 DIAGNOSIS — E785 Hyperlipidemia, unspecified: Secondary | ICD-10-CM

## 2013-10-26 DIAGNOSIS — I10 Essential (primary) hypertension: Secondary | ICD-10-CM

## 2013-10-26 DIAGNOSIS — E119 Type 2 diabetes mellitus without complications: Secondary | ICD-10-CM

## 2013-10-26 LAB — HEMOGLOBIN A1C: Hgb A1c MFr Bld: 7.1 % — ABNORMAL HIGH (ref 4.6–6.5)

## 2013-10-26 MED ORDER — TELMISARTAN-HCTZ 40-12.5 MG PO TABS: 1.0000 | ORAL_TABLET | Freq: Every day | ORAL | Status: DC

## 2013-10-26 NOTE — Progress Notes (Signed)
  Subjective:    Patient ID: Amanda Obrien, female    DOB: Apr 26, 1956, 57 y.o.   MRN: [id number]  HPI  Patient here for medical followup. She has type 2 diabetes, hyperlipidemia, and hypertension. She is unfortunately not lost any weight since last visit. Not exercising any. Blood sugars have been slightly elevated recently. She does not bring log for review. No symptoms of hyperglycemia. Blood pressures been fairly well controlled by home readings but occasional readings over 140 systolic. No headaches or dizziness. No chest pains. Last hemoglobin A1c 7.0%. Recent lipids LDL 111. She is on simvastatin 20 mg daily. She gets regular eye exams in January. No recent visual changes. No neuropathy symptoms.  Past Medical History  Diagnosis Date  . Chicken pox   . Hypertension   . Hyperlipidemia   . Hepatitis   . Diabetes mellitus 1998    type ll   Past Surgical History  Procedure Laterality Date  . Cholecystectomy  1990  . Abdominal hysterectomy  1990  . Breast reduction surgery  1996    reports that she has never smoked. She has never used smokeless tobacco. She reports that she does not drink alcohol or use illicit drugs. family history includes Arthritis in her mother; Cancer in her sister; Cancer (age of onset: 72) in her father; Diabetes in her maternal grandfather and mother; Hyperlipidemia in her mother; Hypertension in her mother; Stroke in her maternal grandmother. There is no history of Colon cancer or Stomach cancer. No Known Allergies   Review of Systems  Constitutional: Negative for fatigue and unexpected weight change.  Eyes: Negative for visual disturbance.  Respiratory: Negative for cough, chest tightness, shortness of breath and wheezing.   Cardiovascular: Negative for chest pain, palpitations and leg swelling.  Endocrine: Negative for polydipsia and polyuria.  Neurological: Negative for dizziness, seizures, syncope, weakness, light-headedness and headaches.        Objective:   Physical Exam  Constitutional: She is oriented to person, place, and time. She appears well-developed and well-nourished.  Neck: Neck supple. No thyromegaly present.  Cardiovascular: Normal rate and regular rhythm.   Pulmonary/Chest: Effort normal and breath sounds normal. No respiratory distress. She has no wheezes. She has no rales.  Musculoskeletal: She exhibits no edema.  Neurological: She is alert and oriented to person, place, and time.  Skin:  Feet reveal no skin lesions. Good distal foot pulses. Good capillary refill. No calluses. Normal sensation with monofilament testing   Psychiatric: She has a normal mood and affect. Her behavior is normal. Thought content normal.          Assessment & Plan:  #1 type 2 diabetes. History of fair control. Recheck A1c. Continue yearly eye exam #2 hypertension. Suboptimal control but her days reading. Repeat blood pressure left arm seated by me 150/80. Increase my Cardis HCTZ 40/12.5 mg to one full tablet daily. Reassess blood pressure 3 months. Work on weight loss #3 hyperlipidemia. Currently treated with simvastatin. Repeat lipids at followup

## 2013-10-26 NOTE — Patient Instructions (Signed)
Increase Telmisartan to one full tablet daily Lose some weight Let's plan follow up in 3 months and bring blood pressure readings then for review.

## 2013-10-26 NOTE — Addendum Note (Signed)
Addended by: Thomasena Edis on: 10/26/2013 09:15 AM   Modules accepted: Orders

## 2013-11-07 ENCOUNTER — Other Ambulatory Visit: Payer: Self-pay | Admitting: Family Medicine

## 2014-01-25 ENCOUNTER — Encounter: Payer: Self-pay | Admitting: Family Medicine

## 2014-01-25 ENCOUNTER — Ambulatory Visit (INDEPENDENT_AMBULATORY_CARE_PROVIDER_SITE_OTHER): Payer: 59 | Admitting: Family Medicine

## 2014-01-25 VITALS — BP 134/76 | HR 74 | Temp 97.4°F | Wt 173.0 lb

## 2014-01-25 DIAGNOSIS — E119 Type 2 diabetes mellitus without complications: Secondary | ICD-10-CM

## 2014-01-25 DIAGNOSIS — E785 Hyperlipidemia, unspecified: Secondary | ICD-10-CM

## 2014-01-25 DIAGNOSIS — I10 Essential (primary) hypertension: Secondary | ICD-10-CM

## 2014-01-25 LAB — HEMOGLOBIN A1C: HEMOGLOBIN A1C: 7 % — AB (ref 4.6–6.5)

## 2014-01-25 NOTE — Patient Instructions (Signed)
Type 2 Diabetes Mellitus, Adult Type 2 diabetes mellitus, often simply referred to as type 2 diabetes, is a long-lasting (chronic) disease. In type 2 diabetes, the pancreas does not make enough insulin (a hormone), the cells are less responsive to the insulin that is made (insulin resistance), or both. Normally, insulin moves sugars from food into the tissue cells. The tissue cells use the sugars for energy. The lack of insulin or the lack of normal response to insulin causes excess sugars to build up in the blood instead of going into the tissue cells. As a result, high blood sugar (hyperglycemia) develops. The effect of high sugar (glucose) levels can cause many complications. Type 2 diabetes was also previously called adult-onset diabetes but it can occur at any age.  RISK FACTORS  A person is predisposed to developing type 2 diabetes if someone in the family has the disease and also has one or more of the following primary risk factors:  Overweight.  An inactive lifestyle.  A history of consistently eating high-calorie foods. Maintaining a normal weight and regular physical activity can reduce the chance of developing type 2 diabetes. SYMPTOMS  A person with type 2 diabetes may not show symptoms initially. The symptoms of type 2 diabetes appear slowly. The symptoms include:  Increased thirst (polydipsia).  Increased urination (polyuria).  Increased urination during the night (nocturia).  Weight loss. This weight loss may be rapid.  Frequent, recurring infections.  Tiredness (fatigue).  Weakness.  Vision changes, such as blurred vision.  Fruity smell to your breath.  Abdominal pain.  Nausea or vomiting.  Cuts or bruises which are slow to heal.  Tingling or numbness in the hands or feet. DIAGNOSIS Type 2 diabetes is frequently not diagnosed until complications of diabetes are present. Type 2 diabetes is diagnosed when symptoms or complications are present and when blood  glucose levels are increased. Your blood glucose level may be checked by one or more of the following blood tests:  A fasting blood glucose test. You will not be allowed to eat for at least 8 hours before a blood sample is taken.  A random blood glucose test. Your blood glucose is checked at any time of the day regardless of when you ate.  A hemoglobin A1c blood glucose test. A hemoglobin A1c test provides information about blood glucose control over the previous 3 months.  An oral glucose tolerance test (OGTT). Your blood glucose is measured after you have not eaten (fasted) for 2 hours and then after you drink a glucose-containing beverage. TREATMENT   You may need to take insulin or diabetes medicine daily to keep blood glucose levels in the desired range.  You will need to match insulin dosing with exercise and healthy food choices. The treatment goal is to maintain the before meal blood sugar (preprandial glucose) level at 70 130 mg/dL. HOME CARE INSTRUCTIONS   Have your hemoglobin A1c level checked twice a year.  Perform daily blood glucose monitoring as directed by your caregiver.  Monitor urine ketones when you are ill and as directed by your caregiver.  Take your diabetes medicine or insulin as directed by your caregiver to maintain your blood glucose levels in the desired range.  Never run out of diabetes medicine or insulin. It is needed every day.  Adjust insulin based on your intake of carbohydrates. Carbohydrates can raise blood glucose levels but need to be included in your diet. Carbohydrates provide vitamins, minerals, and fiber which are an essential part of   a healthy diet. Carbohydrates are found in fruits, vegetables, whole grains, dairy products, legumes, and foods containing added sugars.    Eat healthy foods. Alternate 3 meals with 3 snacks.  Lose weight if overweight.  Carry a medical alert card or wear your medical alert jewelry.  Carry a 15 gram  carbohydrate snack with you at all times to treat low blood glucose (hypoglycemia). Some examples of 15 gram carbohydrate snacks include:  Glucose tablets, 3 or 4   Glucose gel, 15 gram tube  Raisins, 2 tablespoons (24 grams)  Jelly beans, 6  Animal crackers, 8  Regular pop, 4 ounces (120 mL)  Gummy treats, 9  Recognize hypoglycemia. Hypoglycemia occurs with blood glucose levels of 70 mg/dL and below. The risk for hypoglycemia increases when fasting or skipping meals, during or after intense exercise, and during sleep. Hypoglycemia symptoms can include:  Tremors or shakes.  Decreased ability to concentrate.  Sweating.  Increased heart rate.  Headache.  Dry mouth.  Hunger.  Irritability.  Anxiety.  Restless sleep.  Altered speech or coordination.  Confusion.  Treat hypoglycemia promptly. If you are alert and able to safely swallow, follow the 15:15 rule:  Take 15 20 grams of rapid-acting glucose or carbohydrate. Rapid-acting options include glucose gel, glucose tablets, or 4 ounces (120 mL) of fruit juice, regular soda, or low fat milk.  Check your blood glucose level 15 minutes after taking the glucose.  Take 15 20 grams more of glucose if the repeat blood glucose level is still 70 mg/dL or below.  Eat a meal or snack within 1 hour once blood glucose levels return to normal.    Be alert to polyuria and polydipsia which are early signs of hyperglycemia. An early awareness of hyperglycemia allows for prompt treatment. Treat hyperglycemia as directed by your caregiver.  Engage in at least 150 minutes of moderate-intensity physical activity a week, spread over at least 3 days of the week or as directed by your caregiver. In addition, you should engage in resistance exercise at least 2 times a week or as directed by your caregiver.  Adjust your medicine and food intake as needed if you start a new exercise or sport.  Follow your sick day plan at any time you  are unable to eat or drink as usual.  Avoid tobacco use.  Limit alcohol intake to no more than 1 drink per day for nonpregnant women and 2 drinks per day for men. You should drink alcohol only when you are also eating food. Talk with your caregiver whether alcohol is safe for you. Tell your caregiver if you drink alcohol several times a week.  Follow up with your caregiver regularly.  Schedule an eye exam soon after the diagnosis of type 2 diabetes and then annually.  Perform daily skin and foot care. Examine your skin and feet daily for cuts, bruises, redness, nail problems, bleeding, blisters, or sores. A foot exam by a caregiver should be done annually.  Brush your teeth and gums at least twice a day and floss at least once a day. Follow up with your dentist regularly.  Share your diabetes management plan with your workplace or school.  Stay up-to-date with immunizations.  Learn to manage stress.  Obtain ongoing diabetes education and support as needed.  Participate in, or seek rehabilitation as needed to maintain or improve independence and quality of life. Request a physical or occupational therapy referral if you are having foot or hand numbness or difficulties with grooming,   dressing, eating, or physical activity. SEEK MEDICAL CARE IF:   You are unable to eat food or drink fluids for more than 6 hours.  You have nausea and vomiting for more than 6 hours.  Your blood glucose level is over 240 mg/dL.  There is a change in mental status.  You develop an additional serious illness.  You have diarrhea for more than 6 hours.  You have been sick or have had a fever for a couple of days and are not getting better.  You have pain during any physical activity.  SEEK IMMEDIATE MEDICAL CARE IF:  You have difficulty breathing.  You have moderate to large ketone levels. MAKE SURE YOU:  Understand these instructions.  Will watch your condition.  Will get help right away if  you are not doing well or get worse. Document Released: 12/06/2005 Document Revised: 08/30/2012 Document Reviewed: 07/04/2012 [employer] Patient Information 2014 [employer]  

## 2014-01-25 NOTE — Progress Notes (Signed)
   Subjective:    Patient ID: Amanda Obrien, female    DOB: 1956-06-10, 58 y.o.   MRN: [id number]  HPI Patient seen for followup regarding type 2 diabetes, hypertension, hyperlipidemia Last visit we elevated her Micardis and her blood pressures been stable. No dizziness. No headaches. No chest pains. Compliant with all medications.  Type 2 diabetes. Not monitoring blood sugars regularly. Last A1c 7.1%. Unfortunately, she has gained 2 pounds since last visit. No consistent exercise. No history of any visual difficulties. No neuropathy history  Hyperlipidemia treated with simvastatin. Denies any myalgias. She's had high triglycerides and low HDL. No cardiac history  Past Medical History  Diagnosis Date  . Chicken pox   . Hypertension   . Hyperlipidemia   . Hepatitis   . Diabetes mellitus 1998    type ll   Past Surgical History  Procedure Laterality Date  . Cholecystectomy  1990  . Abdominal hysterectomy  1990  . Breast reduction surgery  1996    reports that she has never smoked. She has never used smokeless tobacco. She reports that she does not drink alcohol or use illicit drugs. family history includes Arthritis in her mother; Cancer in her sister; Cancer (age of onset: 6252) in her father; Diabetes in her maternal grandfather and mother; Hyperlipidemia in her mother; Hypertension in her mother; Stroke in her maternal grandmother. There is no history of Colon cancer or Stomach cancer. No Known Allergies    Review of Systems  Constitutional: Negative for fever, chills and fatigue.  Eyes: Negative for visual disturbance.  Respiratory: Negative for cough, chest tightness, shortness of breath and wheezing.   Cardiovascular: Negative for chest pain, palpitations and leg swelling.  Gastrointestinal: Negative for abdominal pain.  Endocrine: Negative for polydipsia and polyuria.  Neurological: Negative for dizziness, seizures, syncope, weakness, light-headedness and headaches.      Objective:   Physical Exam  Constitutional: She appears well-developed and well-nourished.  Neck: Neck supple. No thyromegaly present.  Cardiovascular: Normal rate.   Pulmonary/Chest: Effort normal and breath sounds normal. No respiratory distress. She has no wheezes. She has no rales.  Musculoskeletal: She exhibits no edema.          Assessment & Plan:  #1 hypertension. Improved. Continue current medications #2 dyslipidemia. Repeat lipid and hepatic panel #3 type 2 diabetes. Marginal control. Recheck A1c. We had long talk about importance of weight loss and establishing more consistent exercise.

## 2014-01-25 NOTE — Progress Notes (Signed)
Pre visit review using our clinic review tool, if applicable. No additional management support is needed unless otherwise documented below in the visit note. 

## 2014-01-28 ENCOUNTER — Telehealth: Payer: Self-pay | Admitting: Family Medicine

## 2014-01-28 LAB — HEPATIC FUNCTION PANEL
ALK PHOS: 70 U/L (ref 39–117)
ALT: 20 U/L (ref 0–35)
AST: 20 U/L (ref 0–37)
Albumin: 4 g/dL (ref 3.5–5.2)
BILIRUBIN DIRECT: 0.1 mg/dL (ref 0.0–0.3)
BILIRUBIN TOTAL: 0.6 mg/dL (ref 0.3–1.2)
TOTAL PROTEIN: 7.2 g/dL (ref 6.0–8.3)

## 2014-01-28 LAB — LIPID PANEL
CHOL/HDL RATIO: 5
Cholesterol: 187 mg/dL (ref 0–200)
HDL: 39.4 mg/dL (ref >39.00)
Triglycerides: 210 mg/dL — ABNORMAL HIGH (ref 0.0–149.0)
VLDL: 42 mg/dL — AB (ref 0.0–40.0)

## 2014-01-28 LAB — LDL CHOLESTEROL, DIRECT: LDL DIRECT: 117.1 mg/dL

## 2014-01-28 NOTE — Telephone Encounter (Signed)
Relevant patient education assigned to patient using Emmi. ° °

## 2014-01-29 ENCOUNTER — Other Ambulatory Visit: Payer: Self-pay

## 2014-01-29 ENCOUNTER — Other Ambulatory Visit: Payer: Self-pay | Admitting: Family Medicine

## 2014-01-29 DIAGNOSIS — E785 Hyperlipidemia, unspecified: Secondary | ICD-10-CM

## 2014-01-29 MED ORDER — PRAVASTATIN SODIUM 20 MG PO TABS: 20.0000 mg | ORAL_TABLET | Freq: Every day | ORAL | Status: DC

## 2014-01-30 ENCOUNTER — Telehealth: Payer: Self-pay

## 2014-01-30 NOTE — Telephone Encounter (Signed)
Relevant patient education assigned to patient using Emmi. ° °

## 2014-04-25 ENCOUNTER — Encounter: Payer: Self-pay | Admitting: Family Medicine

## 2014-04-25 ENCOUNTER — Ambulatory Visit (INDEPENDENT_AMBULATORY_CARE_PROVIDER_SITE_OTHER): Payer: 59 | Admitting: Family Medicine

## 2014-04-25 ENCOUNTER — Other Ambulatory Visit: Payer: Self-pay

## 2014-04-25 VITALS — BP 130/70 | HR 72 | Wt 167.0 lb

## 2014-04-25 DIAGNOSIS — E119 Type 2 diabetes mellitus without complications: Secondary | ICD-10-CM

## 2014-04-25 DIAGNOSIS — I10 Essential (primary) hypertension: Secondary | ICD-10-CM

## 2014-04-25 DIAGNOSIS — E785 Hyperlipidemia, unspecified: Secondary | ICD-10-CM

## 2014-04-25 LAB — LIPID PANEL
CHOLESTEROL: 170 mg/dL (ref 0–200)
HDL: 36.8 mg/dL — ABNORMAL LOW (ref >39.00)
LDL CALC: 92 mg/dL (ref 0–99)
Total CHOL/HDL Ratio: 5
Triglycerides: 204 mg/dL — ABNORMAL HIGH (ref 0.0–149.0)
VLDL: 40.8 mg/dL — ABNORMAL HIGH (ref 0.0–40.0)

## 2014-04-25 LAB — HEPATIC FUNCTION PANEL
ALT: 18 U/L (ref 0–35)
AST: 16 U/L (ref 0–37)
Albumin: 4.1 g/dL (ref 3.5–5.2)
Alkaline Phosphatase: 80 U/L (ref 39–117)
BILIRUBIN DIRECT: 0 mg/dL (ref 0.0–0.3)
Total Bilirubin: 1 mg/dL (ref 0.2–1.2)
Total Protein: 7.3 g/dL (ref 6.0–8.3)

## 2014-04-25 LAB — HEMOGLOBIN A1C: HEMOGLOBIN A1C: 7 % — AB (ref 4.6–6.5)

## 2014-04-25 MED ORDER — PRAVASTATIN SODIUM 20 MG PO TABS: 20.0000 mg | ORAL_TABLET | Freq: Every day | ORAL | Status: DC

## 2014-04-25 MED ORDER — METFORMIN HCL 1000 MG PO TABS: ORAL_TABLET | ORAL | Status: DC

## 2014-04-25 MED ORDER — TELMISARTAN-HCTZ 40-12.5 MG PO TABS: 1.0000 | ORAL_TABLET | Freq: Every day | ORAL | Status: DC

## 2014-04-25 NOTE — Progress Notes (Signed)
   Subjective:    Patient ID: Amanda PutnamCathy W Riss, female    DOB: 06/03/1956, 58 y.o.   MRN: [id number]  HPI  Patient here for followup regarding type 2 diabetes and hyperlipidemia. Her A1c has been fairly well controlled and fasting blood sugars remain stable. She remains on low-dose metformin 500 mg twice daily. Last A1c 7.0%. She is overdue for eye exam. She has had no history of complications of diabetes. She starting to walk more and has lost a few pounds recently.  Dyslipidemia. Recent LDL 111. We apparently misunderstood and thought she was not taking statin but she states she was taking low-dose simvastatin. We switched her to pravastatin 20 mg once daily which is tolerating well no side effects.  Past Medical History  Diagnosis Date  . Chicken pox   . Hypertension   . Hyperlipidemia   . Hepatitis   . Diabetes mellitus 1998    type ll   Past Surgical History  Procedure Laterality Date  . Cholecystectomy  1990  . Abdominal hysterectomy  1990  . Breast reduction surgery  1996    reports that she has never smoked. She has never used smokeless tobacco. She reports that she does not drink alcohol or use illicit drugs. family history includes Arthritis in her mother; Cancer in her sister; Cancer (age of onset: 8052) in her father; Diabetes in her maternal grandfather and mother; Hyperlipidemia in her mother; Hypertension in her mother; Stroke in her maternal grandmother. There is no history of Colon cancer or Stomach cancer. No Known Allergies    Review of Systems  Constitutional: Negative for fatigue and unexpected weight change.  Eyes: Negative for visual disturbance.  Respiratory: Negative for cough, chest tightness, shortness of breath and wheezing.   Cardiovascular: Negative for chest pain, palpitations and leg swelling.  Endocrine: Negative for polydipsia and polyuria.  Neurological: Negative for dizziness, syncope, weakness and light-headedness.       Objective:   Physical  Exam  Constitutional: She appears well-developed and well-nourished. No distress.  Neck: Neck supple. No thyromegaly present.  Cardiovascular: Normal rate.   Pulmonary/Chest: Effort normal and breath sounds normal. No respiratory distress. She has no wheezes. She has no rales.  Musculoskeletal: She exhibits no edema.          Assessment & Plan:  # 1 type 2 diabetes. History of good control. Recheck A1c. If still stable check every 6 months. Continue weight loss efforts #2 hypertension. Stable at goal. #3 dyslipidemia. Repeat lipid and hepatic panel. Goal LDL is 100

## 2014-04-25 NOTE — Progress Notes (Signed)
Pre visit review using our clinic review tool, if applicable. No additional management support is needed unless otherwise documented below in the visit note. 

## 2014-09-19 ENCOUNTER — Other Ambulatory Visit: Payer: Self-pay

## 2014-09-19 DIAGNOSIS — Z1239 Encounter for other screening for malignant neoplasm of breast: Secondary | ICD-10-CM

## 2014-09-20 ENCOUNTER — Encounter: Payer: Self-pay | Admitting: Family Medicine

## 2014-09-20 ENCOUNTER — Ambulatory Visit (INDEPENDENT_AMBULATORY_CARE_PROVIDER_SITE_OTHER): Payer: 59 | Admitting: Family Medicine

## 2014-09-20 VITALS — BP 130/76 | HR 82 | Temp 97.9°F | Wt 172.0 lb

## 2014-09-20 DIAGNOSIS — H938X2 Other specified disorders of left ear: Secondary | ICD-10-CM

## 2014-09-20 NOTE — Progress Notes (Signed)
Pre visit review using our clinic review tool, if applicable. No additional management support is needed unless otherwise documented below in the visit note. 

## 2014-09-20 NOTE — Patient Instructions (Signed)
Follow up promptly for any hearing loss or any persistent unilateral tinnitus

## 2014-09-20 NOTE — Progress Notes (Signed)
   Subjective:    Patient ID: Amanda PutnamCathy W Silos, female    DOB: [birth year], 58 y.o.   MRN: [id number]  Otalgia  Pertinent negatives include no hearing loss.   Acute visit. Left ear fullness for one week. [place] She has some occasional mild ringing. No hearing loss. No drainage. Denies any sore throat. No nasal congestion. Only rare mild headaches. She has not tried any decongestants.  Past Medical History  Diagnosis Date  . Chicken pox   . Hypertension   . Hyperlipidemia   . Hepatitis   . Diabetes mellitus 1998    type ll   Past Surgical History  Procedure Laterality Date  . Cholecystectomy  1990  . Abdominal hysterectomy  1990  . Breast reduction surgery  1996    reports that she has never smoked. She has never used smokeless tobacco. She reports that she does not drink alcohol or use illicit drugs. family history includes Arthritis in her mother; Cancer in her sister; Cancer (age of onset: 6452) in her father; Diabetes in her maternal grandfather and mother; Hyperlipidemia in her mother; Hypertension in her mother; Stroke in her maternal grandmother. There is no history of Colon cancer or Stomach cancer. No Known Allergies    Review of Systems  Constitutional: Negative for fever and chills.  HENT: Positive for ear pain. Negative for congestion and hearing loss.   Neurological: Negative for dizziness.       Objective:   Physical Exam  Constitutional: She is oriented to person, place, and time. She appears well-developed and well-nourished.  HENT:  Right Ear: External ear normal.  Left Ear: External ear normal.  Mouth/Throat: Oropharynx is clear and moist.  Cardiovascular: Normal rate and regular rhythm.   Neurological: She is alert and oriented to person, place, and time. No cranial nerve deficit.          Assessment & Plan:  Left ear fullness. Normal exam. No cerumen. No evidence for serous otitis. No evidence for infection. Question  barotitis media. She'll try short-term use of over-the-counter decongestant. Followup promptly for hearing changes or dizziness

## 2014-10-10 ENCOUNTER — Other Ambulatory Visit: Payer: Self-pay

## 2014-10-10 DIAGNOSIS — Z1231 Encounter for screening mammogram for malignant neoplasm of breast: Secondary | ICD-10-CM

## 2014-10-17 ENCOUNTER — Ambulatory Visit
Admission: RE | Admit: 2014-10-17 | Discharge: 2014-10-17 | Disposition: A | Discharge status: Home / Self Care | Payer: 59 | Source: Ambulatory Visit

## 2014-10-17 DIAGNOSIS — Z1231 Encounter for screening mammogram for malignant neoplasm of breast: Secondary | ICD-10-CM

## 2014-10-25 ENCOUNTER — Ambulatory Visit (INDEPENDENT_AMBULATORY_CARE_PROVIDER_SITE_OTHER): Payer: 59

## 2014-10-25 ENCOUNTER — Ambulatory Visit (INDEPENDENT_AMBULATORY_CARE_PROVIDER_SITE_OTHER): Payer: 59 | Admitting: Family Medicine

## 2014-10-25 ENCOUNTER — Encounter: Payer: Self-pay | Admitting: Family Medicine

## 2014-10-25 VITALS — BP 132/70 | HR 79 | Temp 98.2°F | Wt 173.0 lb

## 2014-10-25 DIAGNOSIS — Z23 Encounter for immunization: Secondary | ICD-10-CM

## 2014-10-25 DIAGNOSIS — E119 Type 2 diabetes mellitus without complications: Secondary | ICD-10-CM

## 2014-10-25 LAB — HEMOGLOBIN A1C: Hgb A1c MFr Bld: 6.9 % — ABNORMAL HIGH (ref 4.6–6.5)

## 2014-10-25 NOTE — Progress Notes (Signed)
   Subjective:    Patient ID: Amanda Obrien, female    DOB: 20-Feb-1956, 58 y.o.   MRN: [id number]  HPI Patient seen for medical follow-up. She has history of type 2 diabetes which has been fairly well controlled, dyslipidemia, and hypertension. She is dealing with tremendous stressors with both her mother and mother-in-law health problems and sister dying of cancer. Her diabetes been relatively stable. Last A1c 7.0%. She had eye exam in July. Blood pressures been stable. Her medications reviewed and she is compliant with all.  Past Medical History  Diagnosis Date  . Chicken pox   . Hypertension   . Hyperlipidemia   . Hepatitis   . Diabetes mellitus 1998    type ll   Past Surgical History  Procedure Laterality Date  . Cholecystectomy  1990  . Abdominal hysterectomy  1990  . Breast reduction surgery  1996    reports that she has never smoked. She has never used smokeless tobacco. She reports that she does not drink alcohol or use illicit drugs. family history includes Arthritis in her mother; Cancer in her sister; Cancer (age of onset: 7952) in her father; Diabetes in her maternal grandfather and mother; Hyperlipidemia in her mother; Hypertension in her mother; Stroke in her maternal grandmother. There is no history of Colon cancer or Stomach cancer. No Known Allergies    Review of Systems  Constitutional: Negative for fever, chills, appetite change and fatigue.  Respiratory: Negative for cough, shortness of breath and wheezing.   Cardiovascular: Negative for chest pain, palpitations and leg swelling.  Genitourinary: Negative for dysuria.  Neurological: Negative for dizziness, syncope and headaches.       Objective:   Physical Exam  Constitutional: She appears well-developed and well-nourished.  Neck: Neck supple. No thyromegaly present.  Cardiovascular: Normal rate and regular rhythm.  Exam reveals no gallop.   Pulmonary/Chest: Effort normal and breath sounds normal. No  respiratory distress. She has no wheezes. She has no rales.  Musculoskeletal: She exhibits no edema.          Assessment & Plan:  #1 type 2 diabetes. History of good control. Recheck A1c. Flu vaccine given #2 hypertension which is well controlled. Continue current medication. We'll plan routine follow-up 6 months and repeat lipid panel then.

## 2014-10-25 NOTE — Progress Notes (Signed)
Pre visit review using our clinic review tool, if applicable. No additional management support is needed unless otherwise documented below in the visit note. 

## 2015-02-14 ENCOUNTER — Encounter: Payer: Self-pay | Admitting: Family Medicine

## 2015-02-14 ENCOUNTER — Ambulatory Visit (INDEPENDENT_AMBULATORY_CARE_PROVIDER_SITE_OTHER): Payer: 59 | Admitting: Family Medicine

## 2015-02-14 DIAGNOSIS — R350 Frequency of micturition: Secondary | ICD-10-CM

## 2015-02-14 LAB — POCT URINALYSIS DIPSTICK
Bilirubin, UA: NEGATIVE
Blood, UA: NEGATIVE
Glucose, UA: NEGATIVE
KETONES UA: NEGATIVE
Nitrite, UA: POSITIVE
PH UA: 6
PROTEIN UA: NEGATIVE
Spec Grav, UA: <=1.005
Urobilinogen, UA: 0.2

## 2015-02-14 MED ORDER — CEPHALEXIN 500 MG PO CAPS: 500.0000 mg | ORAL_CAPSULE | Freq: Three times a day (TID) | ORAL | Status: DC

## 2015-02-14 NOTE — Progress Notes (Signed)
Pre visit review using our clinic review tool, if applicable. No additional management support is needed unless otherwise documented below in the visit note. 

## 2015-02-14 NOTE — Patient Instructions (Signed)

## 2015-02-14 NOTE — Progress Notes (Signed)
   Subjective:    Patient ID: Amanda PutnamCathy W Pizzini, female    DOB: 1956/03/09, 59 y.o.   MRN: [id number]  HPI Acute visit. Patient seen with urine frequency past couple days. Only minimal burning. No flank pain. Denies a nausea or vomiting. No fevers or chills. No gross hematuria. No known drug allergies. Type 2 diabetes which is been fairly well controlled.  Past Medical History  Diagnosis Date  . Chicken pox   . Hypertension   . Hyperlipidemia   . Hepatitis   . Diabetes mellitus 1998    type ll   Past Surgical History  Procedure Laterality Date  . Cholecystectomy  1990  . Abdominal hysterectomy  1990  . Breast reduction surgery  1996    reports that she has never smoked. She has never used smokeless tobacco. She reports that she does not drink alcohol or use illicit drugs. family history includes Arthritis in her mother; Cancer in her sister; Cancer (age of onset: 152) in her father; Diabetes in her maternal grandfather and mother; Hyperlipidemia in her mother; Hypertension in her mother; Stroke in her maternal grandmother. There is no history of Colon cancer or Stomach cancer. No Known Allergies    Review of Systems  Constitutional: Negative for fever, chills and appetite change.  Gastrointestinal: Negative for nausea, vomiting, abdominal pain, diarrhea and constipation.  Endocrine: Negative for polydipsia.  Genitourinary: Positive for dysuria and frequency. Negative for hematuria.  Musculoskeletal: Negative for back pain.  Neurological: Negative for dizziness.       Objective:   Physical Exam  Constitutional: She appears well-developed and well-nourished. No distress.  Cardiovascular: Normal rate and regular rhythm.   Pulmonary/Chest: Effort normal and breath sounds normal. No respiratory distress. She has no wheezes. She has no rales.          Assessment & Plan:  Urine frequency. Urine dipstick suggests uncomplicated cystitis. Urine culture sent. Keflex 500 mg 3 times  a day for 5 days. Hydrate well. Follow-up as needed.

## 2015-02-16 LAB — URINE CULTURE: Colony Count: 10000

## 2015-04-24 ENCOUNTER — Other Ambulatory Visit: Payer: Self-pay | Admitting: Family Medicine

## 2015-04-25 ENCOUNTER — Encounter: Payer: Self-pay | Admitting: Family Medicine

## 2015-04-25 ENCOUNTER — Ambulatory Visit (INDEPENDENT_AMBULATORY_CARE_PROVIDER_SITE_OTHER): Payer: 59 | Admitting: Family Medicine

## 2015-04-25 VITALS — BP 130/72 | HR 70 | Temp 97.9°F | Wt 145.0 lb

## 2015-04-25 DIAGNOSIS — E785 Hyperlipidemia, unspecified: Secondary | ICD-10-CM | POA: Diagnosis not present

## 2015-04-25 DIAGNOSIS — E119 Type 2 diabetes mellitus without complications: Secondary | ICD-10-CM

## 2015-04-25 DIAGNOSIS — I10 Essential (primary) hypertension: Secondary | ICD-10-CM | POA: Diagnosis not present

## 2015-04-25 LAB — BASIC METABOLIC PANEL
BUN: 20 mg/dL (ref 6–23)
CO2: 28 mEq/L (ref 19–32)
Calcium: 9.9 mg/dL (ref 8.4–10.5)
Chloride: 103 mEq/L (ref 96–112)
Creatinine, Ser: 1.07 mg/dL (ref 0.40–1.20)
GFR: 55.87 mL/min — AB (ref >60.00)
Glucose, Bld: 115 mg/dL — ABNORMAL HIGH (ref 70–99)
Potassium: 4.3 mEq/L (ref 3.5–5.1)
Sodium: 141 mEq/L (ref 135–145)

## 2015-04-25 LAB — HEPATIC FUNCTION PANEL
ALK PHOS: 72 U/L (ref 39–117)
ALT: 9 U/L (ref 0–35)
AST: 13 U/L (ref 0–37)
Albumin: 4.2 g/dL (ref 3.5–5.2)
Bilirubin, Direct: 0.1 mg/dL (ref 0.0–0.3)
Total Bilirubin: 0.4 mg/dL (ref 0.2–1.2)
Total Protein: 7.4 g/dL (ref 6.0–8.3)

## 2015-04-25 LAB — LIPID PANEL
CHOL/HDL RATIO: 6
Cholesterol: 217 mg/dL — ABNORMAL HIGH (ref 0–200)
HDL: 38 mg/dL — AB (ref >39.00)
LDL Cholesterol: 142 mg/dL — ABNORMAL HIGH (ref 0–99)
NonHDL: 179
TRIGLYCERIDES: 184 mg/dL — AB (ref 0.0–149.0)
VLDL: 36.8 mg/dL (ref 0.0–40.0)

## 2015-04-25 LAB — HM DIABETES EYE EXAM

## 2015-04-25 LAB — HM DIABETES FOOT EXAM: HM Diabetic Foot Exam: NORMAL

## 2015-04-25 LAB — HEMOGLOBIN A1C: Hgb A1c MFr Bld: 6.1 % (ref 4.6–6.5)

## 2015-04-25 NOTE — Progress Notes (Signed)
Pre visit review using our clinic review tool, if applicable. No additional management support is needed unless otherwise documented below in the visit note. 

## 2015-04-25 NOTE — Progress Notes (Signed)
   Subjective:    Patient ID: Amanda Obrien, female    DOB: [birth year], 59 y.o.   MRN: [id number]  HPI Patient is here for medical follow-up. She has history of type 2 diabetes, hypertension, and hyperlipidemia. She's done a tremendous job weight loss in recent months. She is walking more and also reducing overall calories. She has pertussis updating with weight watchers. She is lost from 173 pounds in November to current weight of 145 pounds. Chills much better overall. Does not monitor blood sugars regularly. No recent orthostatic symptoms. Compliant with all medications. No recent chest pains. She gets yearly eye exams.  Past Medical History  Diagnosis Date  . Chicken pox   . Hypertension   . Hyperlipidemia   . Hepatitis   . Diabetes mellitus 1998    type ll   Past Surgical History  Procedure Laterality Date  . Cholecystectomy  1990  . Abdominal hysterectomy  1990  . Breast reduction surgery  1996    reports that she has never smoked. She has never used smokeless tobacco. She reports that she does not drink alcohol or use illicit drugs. family history includes Arthritis in her mother; Cancer in her sister; Cancer (age of onset: 1752) in her father; Diabetes in her maternal grandfather and mother; Hyperlipidemia in her mother; Hypertension in her mother; Stroke in her maternal grandmother. There is no history of Colon cancer or Stomach cancer. No Known Allergies    Review of Systems  Constitutional: Negative for appetite change and fatigue.  Eyes: Negative for visual disturbance.  Respiratory: Negative for cough, chest tightness, shortness of breath and wheezing.   Cardiovascular: Negative for chest pain, palpitations and leg swelling.  Endocrine: Negative for polydipsia and polyuria.  Neurological: Negative for dizziness, seizures, syncope, weakness, light-headedness and headaches.       Objective:   Physical Exam  Constitutional: She appears well-developed and  well-nourished.  Cardiovascular: Normal rate and regular rhythm.  Exam reveals no gallop.   No murmur heard. Pulmonary/Chest: Effort normal and breath sounds normal. No respiratory distress. She has no wheezes. She has no rales.  Musculoskeletal: She exhibits no edema.  Skin:  Feet reveal no skin lesions. Good distal foot pulses. Good capillary refill. No calluses. Normal sensation with monofilament testing           Assessment & Plan:  #1 type 2 diabetes. History of good control. Suspect even better control with recent weight loss. Recheck A1c. Consider reducing or possibly even discontinue metformin based on results #2 hypertension stable and at goal #3 dyslipidemia. Repeat lipid panel. Suspect this will be improved as well.

## 2015-05-04 ENCOUNTER — Other Ambulatory Visit: Payer: Self-pay | Admitting: Family Medicine

## 2015-05-29 ENCOUNTER — Other Ambulatory Visit: Payer: Self-pay | Admitting: Family Medicine

## 2015-08-08 ENCOUNTER — Other Ambulatory Visit: Payer: Self-pay | Admitting: Family Medicine

## 2015-10-09 ENCOUNTER — Other Ambulatory Visit: Payer: Self-pay

## 2015-10-09 DIAGNOSIS — Z1231 Encounter for screening mammogram for malignant neoplasm of breast: Secondary | ICD-10-CM

## 2015-10-23 ENCOUNTER — Ambulatory Visit
Admission: RE | Admit: 2015-10-23 | Discharge: 2015-10-23 | Disposition: A | Discharge status: Home / Self Care | Payer: 59 | Source: Ambulatory Visit

## 2015-10-23 DIAGNOSIS — Z1231 Encounter for screening mammogram for malignant neoplasm of breast: Secondary | ICD-10-CM

## 2015-10-24 ENCOUNTER — Ambulatory Visit: Payer: 59 | Admitting: Family Medicine

## 2015-10-31 ENCOUNTER — Ambulatory Visit: Payer: 59 | Admitting: Family Medicine

## 2015-11-04 ENCOUNTER — Other Ambulatory Visit: Payer: Self-pay | Admitting: Family Medicine

## 2015-11-07 ENCOUNTER — Other Ambulatory Visit: Payer: Self-pay | Admitting: Family Medicine

## 2015-11-07 ENCOUNTER — Ambulatory Visit (INDEPENDENT_AMBULATORY_CARE_PROVIDER_SITE_OTHER): Payer: 59 | Admitting: Family Medicine

## 2015-11-07 ENCOUNTER — Encounter: Payer: Self-pay | Admitting: Family Medicine

## 2015-11-07 VITALS — BP 120/80 | HR 77 | Temp 98.1°F | Resp 14 | Ht 64.0 in | Wt 156.8 lb

## 2015-11-07 DIAGNOSIS — E119 Type 2 diabetes mellitus without complications: Secondary | ICD-10-CM

## 2015-11-07 DIAGNOSIS — I10 Essential (primary) hypertension: Secondary | ICD-10-CM

## 2015-11-07 DIAGNOSIS — E785 Hyperlipidemia, unspecified: Secondary | ICD-10-CM

## 2015-11-07 LAB — HEPATIC FUNCTION PANEL
ALBUMIN: 4.2 g/dL (ref 3.5–5.2)
ALK PHOS: 72 U/L (ref 39–117)
ALT: 14 U/L (ref 0–35)
AST: 15 U/L (ref 0–37)
Bilirubin, Direct: 0.1 mg/dL (ref 0.0–0.3)
TOTAL PROTEIN: 7 g/dL (ref 6.0–8.3)
Total Bilirubin: 0.5 mg/dL (ref 0.2–1.2)

## 2015-11-07 LAB — LIPID PANEL
CHOLESTEROL: 206 mg/dL — AB (ref 0–200)
HDL: 46.3 mg/dL (ref >39.00)
LDL Cholesterol: 140 mg/dL — ABNORMAL HIGH (ref 0–99)
NONHDL: 159.3
TRIGLYCERIDES: 97 mg/dL (ref 0.0–149.0)
Total CHOL/HDL Ratio: 4
VLDL: 19.4 mg/dL (ref 0.0–40.0)

## 2015-11-07 LAB — HEMOGLOBIN A1C: Hgb A1c MFr Bld: 6.1 % (ref 4.6–6.5)

## 2015-11-07 MED ORDER — ROSUVASTATIN CALCIUM 10 MG PO TABS: 10.0000 mg | ORAL_TABLET | Freq: Every day | ORAL | Status: DC

## 2015-11-07 NOTE — Progress Notes (Signed)
   Subjective:    Patient ID: Amanda PutnamCathy W Lonigro, female    DOB: 1955/12/24, 59 y.o.   MRN: [id number]  HPI Patient seen for follow-up type 2 diabetes. Last A1c 6.1%. She has unfortunately gained about 11 pounds since then. Increased family stress. Not exercising regularly. Some stress eating. Not monitoring blood sugars. No polyuria or polydipsia.  Hypertension. Felt some lightheadedness recently and she reduced her Micardis to one half tablet. Blood pressure stable. No dizziness since then.  Hyperlipidemia. Takes pravastatin though inconsistently. Recent lipids were elevated. No myalgias. No history of CAD or peripheral vascular disease.  Past Medical History  Diagnosis Date  . Chicken pox   . Hypertension   . Hyperlipidemia   . Hepatitis   . Diabetes mellitus 1998    type ll   Past Surgical History  Procedure Laterality Date  . Cholecystectomy  1990  . Abdominal hysterectomy  1990  . Breast reduction surgery  1996    reports that she has never smoked. She has never used smokeless tobacco. She reports that she does not drink alcohol or use illicit drugs. family history includes Arthritis in her mother; Cancer in her sister; Cancer (age of onset: 1352) in her father; Diabetes in her maternal grandfather and mother; Hyperlipidemia in her mother; Hypertension in her mother; Stroke in her maternal grandmother. There is no history of Colon cancer or Stomach cancer. No Known Allergies    Review of Systems  Constitutional: Negative for fatigue.  Eyes: Negative for visual disturbance.  Respiratory: Negative for cough, chest tightness, shortness of breath and wheezing.   Cardiovascular: Negative for chest pain, palpitations and leg swelling.  Neurological: Negative for dizziness, seizures, syncope, weakness, light-headedness and headaches.       Objective:   Physical Exam  Constitutional: She appears well-developed and well-nourished.  Eyes: Pupils are equal, round, and reactive to  light.  Neck: Neck supple. No JVD present. No thyromegaly present.  Cardiovascular: Normal rate and regular rhythm.  Exam reveals no gallop.   Pulmonary/Chest: Effort normal and breath sounds normal. No respiratory distress. She has no wheezes. She has no rales.  Musculoskeletal: She exhibits no edema.  Neurological: She is alert.          Assessment & Plan:  #1 type 2 diabetes. History of good control. Encouraged to lose weight. Recheck A1c #2 hypertension stable and at goal. Continue current medication with Micardis HCTZ one half tablet daily #3 hyperlipidemia. Recent poor control. Recheck lipids. Goal LDL less than 100. Consider increase pravastatin dose or change to more potent statin

## 2015-11-07 NOTE — Progress Notes (Signed)
Pre visit review using our clinic review tool, if applicable. No additional management support is needed unless otherwise documented below in the visit note. 

## 2015-12-05 ENCOUNTER — Other Ambulatory Visit: Payer: Self-pay | Admitting: Family Medicine

## 2016-01-13 LAB — HM DIABETES EYE EXAM

## 2016-02-18 ENCOUNTER — Ambulatory Visit (INDEPENDENT_AMBULATORY_CARE_PROVIDER_SITE_OTHER): Payer: Commercial Managed Care - HMO | Admitting: Family Medicine

## 2016-02-18 ENCOUNTER — Encounter: Payer: Self-pay | Admitting: Family Medicine

## 2016-02-18 VITALS — BP 120/58 | HR 90 | Temp 99.2°F | Ht 64.0 in | Wt 164.5 lb

## 2016-02-18 DIAGNOSIS — J069 Acute upper respiratory infection, unspecified: Secondary | ICD-10-CM | POA: Diagnosis not present

## 2016-02-18 DIAGNOSIS — J029 Acute pharyngitis, unspecified: Secondary | ICD-10-CM

## 2016-02-18 LAB — POCT INFLUENZA A/B
INFLUENZA B, POC: NEGATIVE
Influenza A, POC: NEGATIVE

## 2016-02-18 LAB — POCT RAPID STREP A (OFFICE): Rapid Strep A Screen: NEGATIVE

## 2016-02-18 NOTE — Progress Notes (Signed)
HPI:  He is a pleasant 60 year old with past medical history of diabetes, high blood pressure, and hyperlipidemia here for an acute visit for sinus congestion: -started: 3-4 days ago -symptoms:nasal congestion, sore throat, cough, low-grade subjective fever, mild body aches -denies:SOB, NVD, tooth pain -has tried: Delsym which has helped with the cough -sick contacts/travel/risks: no reported flu, strep or tick exposure; husband diagnosed with a sinus infection recently -She would like to check for the flu and strep  ROS: See pertinent positives and negatives per HPI.  Past Medical History  Diagnosis Date  . Chicken pox   . Hypertension   . Hyperlipidemia   . Hepatitis   . Diabetes mellitus 1998    type ll    Past Surgical History  Procedure Laterality Date  . Cholecystectomy  1990  . Abdominal hysterectomy  1990  . Breast reduction surgery  1996    Family History  Problem Relation Age of Onset  . Arthritis Mother   . Hyperlipidemia Mother   . Hypertension Mother   . Diabetes Mother     type ll  . Cancer Father 40    bladder and lymphoma  . Cancer Sister   . Stroke Maternal Grandmother   . Diabetes Maternal Grandfather     type ll  . Colon cancer Neg Hx   . Stomach cancer Neg Hx     Social History   Social History  . Marital Status: Married    Spouse Name: N/A  . Number of Children: N/A  . Years of Education: N/A   Social History Main Topics  . Smoking status: Never Smoker   . Smokeless tobacco: Never Used  . Alcohol Use: No  . Drug Use: No  . Sexual Activity: Not Asked   Other Topics Concern  . None   Social History Narrative     Current outpatient prescriptions:  .  calcium carbonate (OS-CAL) 600 MG TABS, Take 600 mg by mouth 2 (two) times daily with a meal., Disp: , Rfl:  .  cholecalciferol (VITAMIN D) 1000 UNITS tablet, Take 1,000 Units by mouth daily., Disp: , Rfl:  .  metFORMIN (GLUCOPHAGE) 1000 MG tablet, TAKE 1/2 TABLET BY MOUTH TWICE  A DAY, Disp: 30 tablet, Rfl: 5 .  Multiple Vitamin (MULTIVITAMIN) tablet, Take 1 tablet by mouth daily., Disp: , Rfl:  .  ONE TOUCH ULTRA TEST test strip, CHECK 2-3 TIMES A DAY AS INSTRUCTED, Disp: 100 each, Rfl: 2 .  pravastatin (PRAVACHOL) 20 MG tablet, TAKE 1 TABLET (20 MG TOTAL) BY MOUTH DAILY., Disp: 30 tablet, Rfl: 6 .  PREMARIN vaginal cream, INSERT 1 GRAM PER VAGINA TWICE WEEKLY, Disp: , Rfl: 4 .  rosuvastatin (CRESTOR) 10 MG tablet, TAKE 1 TABLET DAILY, Disp: 90 tablet, Rfl: 2 .  telmisartan-hydrochlorothiazide (MICARDIS HCT) 40-12.5 MG per tablet, TAKE 1 TABLET EVERY DAY (Patient taking differently: Pt is taking 1/2 tablet daily), Disp: 30 tablet, Rfl: 6  EXAM:  Filed Vitals:   02/18/16 1013  BP: 120/58  Pulse: 90  Temp: 99.2 F (37.3 C)    Body mass index is 28.22 kg/(m^2).  GENERAL: vitals reviewed and listed above, alert, oriented, appears well hydrated and in no acute distress  HEENT: atraumatic, conjunttiva clear, no obvious abnormalities on inspection of external nose and ears, normal appearance of ear canals and TMs, clear nasal congestion, mild post oropharyngeal erythema with PND, no tonsillar edema or exudate, no sinus TTP  NECK: no obvious masses on inspection  LUNGS: clear to  auscultation bilaterally, no wheezes, rales or rhonchi, good air movement  CV: HRRR, no peripheral edema  MS: moves all extremities without noticeable abnormality  PSYCH: pleasant and cooperative, no obvious depression or anxiety  ASSESSMENT AND PLAN:  Discussed the following assessment and plan:  No diagnosis found.  -Rapid flu and strep testing was negative. Given HPI and exam findings today, a serious infection or illness is unlikely. We discussed potential etiologies, with VURI or mild influenza being most likely, and advised supportive care and monitoring. The risk and benefits of Tamiflu were discussed, she is out of the treatment window for likely significant benefit, however  given her risk factors she could still be a candidate. She declined after discussion of risks and benefits. We discussed treatment side effects, likely course,potential complications, antibiotic misuse, transmission, and signs of developing a serious or worsening illness. -of course, we advised to return or notify a doctor immediately if symptoms worsen or persist or new concerns arise.    Patient Instructions  Before you leave: -Rapid flu and strep  INSTRUCTIONS FOR UPPER RESPIRATORY INFECTION:  -plenty of rest and fluids  -nasal saline wash 2-3 times daily (use prepackaged nasal saline or bottled/distilled water if making your own)   -can use AFRIN nasal spray for drainage and nasal congestion - but do NOT use longer then 3-4 days  -can use tylenol (in no history of liver disease) or ibuprofen (if no history of kidney disease, bowel bleeding or significant heart disease) as directed for aches and sorethroat  -in the winter time, using a humidifier at night is helpful (please follow cleaning instructions)  -if you are taking a cough medication - use only as directed, may also try a teaspoon of honey to coat the throat and throat lozenges. If given a cough medication with codeine or hydrocodone or other narcotic please be advised that this contains a strong and  potentially addicting medication. Please follow instructions carefully, take as little as possible and only use AS NEEDED for severe cough. Discuss potential side effects with your pharmacy. Please do not drive or operate machinery while taking these types of medications. Please do not take other sedating medications, drugs or alcohol while taking this medication without discussing with your doctor.  -for sore throat, salt water gargles can help  -follow up if you have fevers, facial pain, tooth pain, difficulty breathing or are worsening or symptoms persist longer then expected  Upper Respiratory Infection, Adult An upper  respiratory infection (URI) is also known as the common cold. It is often caused by a type of germ (virus). Colds are easily spread (contagious). You can pass it to others by kissing, coughing, sneezing, or drinking out of the same glass. Usually, you get better in 1 to 3  weeks.  However, the cough can last for even longer. HOME CARE   Only take medicine as told by your doctor. Follow instructions provided above.  Drink enough water and fluids to keep your pee (urine) clear or pale yellow.  Get plenty of rest.  Return to work when your temperature is < 100 for 24 hours or as told by your doctor. You may use a face mask and wash your hands to stop your cold from spreading. GET HELP RIGHT AWAY IF:   After the first few days, you feel you are getting worse.  You have questions about your medicine.  You have chills, shortness of breath, or red spit (mucus).  You have pain in the face for  more then 1-2 days, especially when you bend forward.  You have a fever, puffy (swollen) neck, pain when you swallow, or white spots in the back of your throat.  You have a bad headache, ear pain, sinus pain, or chest pain.  You have a high-pitched whistling sound when you breathe in and out (wheezing).  You cough up blood.  You have sore muscles or a stiff neck. MAKE SURE YOU:   Understand these instructions.  Will watch your condition.  Will get help right away if you are not doing well or get worse. Document Released: 05/24/2008 Document Revised: 02/28/2012 Document Reviewed: 03/13/2014 [facility] Patient Information 2015 [place], [place]. This information is not intended to replace advice given to you by your health care provider. Make sure you discuss any questions you have with your health care provider.      Colin Benton R.

## 2016-02-18 NOTE — Patient Instructions (Signed)
Before you leave: -Rapid flu and strep  INSTRUCTIONS FOR UPPER RESPIRATORY INFECTION:  -plenty of rest and fluids  -nasal saline wash 2-3 times daily (use prepackaged nasal saline or bottled/distilled water if making your own)   -can use AFRIN nasal spray for drainage and nasal congestion - but do NOT use longer then 3-4 days  -can use tylenol (in no history of liver disease) or ibuprofen (if no history of kidney disease, bowel bleeding or significant heart disease) as directed for aches and sorethroat  -in the winter time, using a humidifier at night is helpful (please follow cleaning instructions)  -if you are taking a cough medication - use only as directed, may also try a teaspoon of honey to coat the throat and throat lozenges. If given a cough medication with codeine or hydrocodone or other narcotic please be advised that this contains a strong and  potentially addicting medication. Please follow instructions carefully, take as little as possible and only use AS NEEDED for severe cough. Discuss potential side effects with your pharmacy. Please do not drive or operate machinery while taking these types of medications. Please do not take other sedating medications, drugs or alcohol while taking this medication without discussing with your doctor.  -for sore throat, salt water gargles can help  -follow up if you have fevers, facial pain, tooth pain, difficulty breathing or are worsening or symptoms persist longer then expected  Upper Respiratory Infection, Adult An upper respiratory infection (URI) is also known as the common cold. It is often caused by a type of germ (virus). Colds are easily spread (contagious). You can pass it to others by kissing, coughing, sneezing, or drinking out of the same glass. Usually, you get better in 1 to 3  weeks.  However, the cough can last for even longer. HOME CARE   Only take medicine as told by your doctor. Follow instructions provided  above.  Drink enough water and fluids to keep your pee (urine) clear or pale yellow.  Get plenty of rest.  Return to work when your temperature is < 100 for 24 hours or as told by your doctor. You may use a face mask and wash your hands to stop your cold from spreading. GET HELP RIGHT AWAY IF:   After the first few days, you feel you are getting worse.  You have questions about your medicine.  You have chills, shortness of breath, or red spit (mucus).  You have pain in the face for more then 1-2 days, especially when you bend forward.  You have a fever, puffy (swollen) neck, pain when you swallow, or white spots in the back of your throat.  You have a bad headache, ear pain, sinus pain, or chest pain.  You have a high-pitched whistling sound when you breathe in and out (wheezing).  You cough up blood.  You have sore muscles or a stiff neck. MAKE SURE YOU:   Understand these instructions.  Will watch your condition.  Will get help right away if you are not doing well or get worse. Document Released: 05/24/2008 Document Revised: 02/28/2012 Document Reviewed: 03/13/2014 [facility] Patient Information 2015 [place], [place]. This information is not intended to replace advice given to you by your health care provider. Make sure you discuss any questions you have with your health care provider.

## 2016-02-18 NOTE — Progress Notes (Signed)
Pre visit review using our clinic review tool, if applicable. No additional management support is needed unless otherwise documented below in the visit note. 

## 2016-02-20 ENCOUNTER — Encounter: Payer: Self-pay | Admitting: Family Medicine

## 2016-02-20 ENCOUNTER — Ambulatory Visit (INDEPENDENT_AMBULATORY_CARE_PROVIDER_SITE_OTHER): Payer: Commercial Managed Care - HMO | Admitting: Family Medicine

## 2016-02-20 VITALS — BP 120/60 | HR 94 | Temp 98.2°F | Ht 64.0 in | Wt 162.0 lb

## 2016-02-20 DIAGNOSIS — J069 Acute upper respiratory infection, unspecified: Secondary | ICD-10-CM | POA: Diagnosis not present

## 2016-02-20 MED ORDER — BENZONATATE 100 MG PO CAPS: 100.0000 mg | ORAL_CAPSULE | Freq: Three times a day (TID) | ORAL | Status: DC | PRN

## 2016-02-20 MED ORDER — AMOXICILLIN-POT CLAVULANATE 875-125 MG PO TABS: 1.0000 | ORAL_TABLET | Freq: Two times a day (BID) | ORAL | Status: DC

## 2016-02-20 NOTE — Patient Instructions (Signed)
Please use the antibiotic if your symptoms are not responding to the treatments below, worsen or persist longer than expected.  INSTRUCTIONS FOR UPPER RESPIRATORY INFECTION:  -plenty of rest and fluids  -nasal saline wash 2-3 times daily (use prepackaged nasal saline or bottled/distilled water if making your own)   -can use AFRIN nasal spray for drainage and nasal congestion - but do NOT use longer then 3-4 days  -can use tylenol (in no history of liver disease) or ibuprofen (if no history of kidney disease, bowel bleeding or significant heart disease) as directed for aches and sorethroat  -in the winter time, using a humidifier at night is helpful (please follow cleaning instructions)  -if you are taking a cough medication - use only as directed, may also try a teaspoon of honey to coat the throat and throat lozenges. If given a cough medication with codeine or hydrocodone or other narcotic please be advised that this contains a strong and  potentially addicting medication. Please follow instructions carefully, take as little as possible and only use AS NEEDED for severe cough. Discuss potential side effects with your pharmacy. Please do not drive or operate machinery while taking these types of medications. Please do not take other sedating medications, drugs or alcohol while taking this medication without discussing with your doctor.  -for sore throat, salt water gargles can help  -follow up if you have fevers, facial pain, tooth pain, difficulty breathing or are worsening or symptoms persist longer then expected  Upper Respiratory Infection, Adult An upper respiratory infection (URI) is also known as the common cold. It is often caused by a type of germ (virus). Colds are easily spread (contagious). You can pass it to others by kissing, coughing, sneezing, or drinking out of the same glass. Usually, you get better in 1 to 3  weeks.  However, the cough can last for even longer. HOME CARE    Only take medicine as told by your doctor. Follow instructions provided above.  Drink enough water and fluids to keep your pee (urine) clear or pale yellow.  Get plenty of rest.  Return to work when your temperature is < 100 for 24 hours or as told by your doctor. You may use a face mask and wash your hands to stop your cold from spreading. GET HELP RIGHT AWAY IF:   After the first few days, you feel you are getting worse.  You have questions about your medicine.  You have chills, shortness of breath, or red spit (mucus).  You have pain in the face for more then 1-2 days, especially when you bend forward.  You have a fever, puffy (swollen) neck, pain when you swallow, or white spots in the back of your throat.  You have a bad headache, ear pain, sinus pain, or chest pain.  You have a high-pitched whistling sound when you breathe in and out (wheezing).  You cough up blood.  You have sore muscles or a stiff neck. MAKE SURE YOU:   Understand these instructions.  Will watch your condition.  Will get help right away if you are not doing well or get worse. Document Released: 05/24/2008 Document Revised: 02/28/2012 Document Reviewed: 03/13/2014 [facility] Patient Information 2015 [employer], [place]. This information is not intended to replace advice given to you by your health care provider. Make sure you discuss any questions you have with your health care provider.

## 2016-02-20 NOTE — Progress Notes (Signed)
Pre visit review using our clinic review tool, if applicable. No additional management support is needed unless otherwise documented below in the visit note. 

## 2016-02-20 NOTE — Progress Notes (Signed)
HPI:  The is a pleasant 60 year old here for an acute visit for sinus congestion. This started about 5-6 days ago. Her symptoms include nasal congestion, postnasal drip, sore throat, right maxillary sinus pressure and sometimes pain, right ear discomfort, a low-grade fever at times and a cough. She denies nausea, vomiting, diarrhea, shortness of breath or tooth pain. She was seen 2 days ago and was diagnosed with a viral upper respiratory infection. She did not try the supportive measures that were advised. Her husband has a sinus, and she is convinced that she also has a sinus infection and wants to take an antibiotic.  ROS: See pertinent positives and negatives per HPI.  Past Medical History  Diagnosis Date  . Chicken pox   . Hypertension   . Hyperlipidemia   . Hepatitis   . Diabetes mellitus 1998    type ll    Past Surgical History  Procedure Laterality Date  . Cholecystectomy  1990  . Abdominal hysterectomy  1990  . Breast reduction surgery  1996    Family History  Problem Relation Age of Onset  . Arthritis Mother   . Hyperlipidemia Mother   . Hypertension Mother   . Diabetes Mother     type ll  . Cancer Father 48    bladder and lymphoma  . Cancer Sister   . Stroke Maternal Grandmother   . Diabetes Maternal Grandfather     type ll  . Colon cancer Neg Hx   . Stomach cancer Neg Hx     Social History   Social History  . Marital Status: Married    Spouse Name: N/A  . Number of Children: N/A  . Years of Education: N/A   Social History Main Topics  . Smoking status: Never Smoker   . Smokeless tobacco: Never Used  . Alcohol Use: No  . Drug Use: No  . Sexual Activity: Not Asked   Other Topics Concern  . None   Social History Narrative     Current outpatient prescriptions:  .  calcium carbonate (OS-CAL) 600 MG TABS, Take 600 mg by mouth 2 (two) times daily with a meal., Disp: , Rfl:  .  cholecalciferol (VITAMIN D) 1000 UNITS tablet, Take 1,000 Units by  mouth daily., Disp: , Rfl:  .  metFORMIN (GLUCOPHAGE) 1000 MG tablet, TAKE 1/2 TABLET BY MOUTH TWICE A DAY, Disp: 30 tablet, Rfl: 5 .  Multiple Vitamin (MULTIVITAMIN) tablet, Take 1 tablet by mouth daily., Disp: , Rfl:  .  ONE TOUCH ULTRA TEST test strip, CHECK 2-3 TIMES A DAY AS INSTRUCTED, Disp: 100 each, Rfl: 2 .  PREMARIN vaginal cream, INSERT 1 GRAM PER VAGINA TWICE WEEKLY, Disp: , Rfl: 4 .  rosuvastatin (CRESTOR) 10 MG tablet, TAKE 1 TABLET DAILY, Disp: 90 tablet, Rfl: 2 .  telmisartan-hydrochlorothiazide (MICARDIS HCT) 40-12.5 MG per tablet, TAKE 1 TABLET EVERY DAY (Patient taking differently: Pt is taking 1/2 tablet daily), Disp: 30 tablet, Rfl: 6  EXAM:  Filed Vitals:   02/20/16 1100  BP: 120/60  Pulse: 94  Temp: 98.2 F (36.8 C)    Body mass index is 27.79 kg/(m^2).  GENERAL: vitals reviewed and listed above, alert, oriented, appears well hydrated and in no acute distress  HEENT: atraumatic, conjunttiva clear, no obvious abnormalities on inspection of external nose and ears, normal appearance of ear canals and TMs, clear nasal congestion, mild post oropharyngeal erythema with PND, no tonsillar edema or exudate, no sinus TTP  NECK: no obvious masses on  inspection  LUNGS: clear to auscultation bilaterally, no wheezes, rales or rhonchi, good air movement  CV: HRRR, no peripheral edema  MS: moves all extremities without noticeable abnormality  PSYCH: pleasant and cooperative, no obvious depression or anxiety  ASSESSMENT AND PLAN:  Discussed the following assessment and plan:  No diagnosis found.  -given HPI and exam findings today, a serious infection or illness is unlikely. We discussed potential etiologies, with VURI being most likely, and advised supportive care and monitoring. She is very concerned about a sinus infection. We did provide an antibiotic prescription for delayed treatment if her symptoms do not respond to the supportive measures advised at her last  visit, worsen or do not resolve as expected. We discussed treatment side effects, likely course, antibiotic misuse, transmission, and signs of developing a serious illness. -of course, we advised to return or notify a doctor immediately if symptoms worsen or persist or new concerns arise.    There are no Patient Instructions on file for this visit.   Kriste BasqueKIM, Deforest Maiden R.

## 2016-04-10 ENCOUNTER — Other Ambulatory Visit: Payer: Self-pay | Admitting: Family Medicine

## 2016-05-06 ENCOUNTER — Ambulatory Visit: Payer: 59 | Admitting: Family Medicine

## 2016-05-11 ENCOUNTER — Other Ambulatory Visit: Payer: Self-pay | Admitting: Family Medicine

## 2016-05-12 ENCOUNTER — Ambulatory Visit (INDEPENDENT_AMBULATORY_CARE_PROVIDER_SITE_OTHER): Payer: Commercial Managed Care - HMO | Admitting: Family Medicine

## 2016-05-12 VITALS — BP 110/80 | HR 88 | Temp 97.9°F | Ht 64.0 in | Wt 166.0 lb

## 2016-05-12 DIAGNOSIS — E785 Hyperlipidemia, unspecified: Secondary | ICD-10-CM

## 2016-05-12 DIAGNOSIS — I10 Essential (primary) hypertension: Secondary | ICD-10-CM | POA: Diagnosis not present

## 2016-05-12 DIAGNOSIS — E119 Type 2 diabetes mellitus without complications: Secondary | ICD-10-CM

## 2016-05-12 LAB — LIPID PANEL
CHOL/HDL RATIO: 4
Cholesterol: 140 mg/dL (ref 0–200)
HDL: 36 mg/dL — AB (ref >39.00)
LDL Cholesterol: 76 mg/dL (ref 0–99)
NONHDL: 104.1
TRIGLYCERIDES: 142 mg/dL (ref 0.0–149.0)
VLDL: 28.4 mg/dL (ref 0.0–40.0)

## 2016-05-12 LAB — HEPATIC FUNCTION PANEL
ALT: 13 U/L (ref 0–35)
AST: 13 U/L (ref 0–37)
Albumin: 4.5 g/dL (ref 3.5–5.2)
Alkaline Phosphatase: 74 U/L (ref 39–117)
Bilirubin, Direct: 0.1 mg/dL (ref 0.0–0.3)
Total Bilirubin: 0.6 mg/dL (ref 0.2–1.2)
Total Protein: 6.8 g/dL (ref 6.0–8.3)

## 2016-05-12 LAB — HEMOGLOBIN A1C: Hgb A1c MFr Bld: 6.8 % — ABNORMAL HIGH (ref 4.6–6.5)

## 2016-05-12 LAB — HM DIABETES FOOT EXAM: HM Diabetic Foot Exam: NORMAL

## 2016-05-12 NOTE — Progress Notes (Signed)
Pre visit review using our clinic review tool, if applicable. No additional management support is needed unless otherwise documented below in the visit note. 

## 2016-05-12 NOTE — Progress Notes (Signed)
   Subjective:    Patient ID: Amanda PutnamCathy W Obrien, female    DOB: 11-04-1956, 60 y.o.   MRN: [id number]  HPI Patient here for medical follow-up Type 2 diabetes. History of good control. Increased family stress. She's gained 10 pounds over the past several months-she attributes this to stress eating. Not exercising. Poor compliance with diet. Remains on metformin. Not monitoring blood sugars. No polyuria or polydipsia. She had eye exam back in January which she states showed no retinopathy No history of neuropathy symptoms  Hyperlipidemia. Last fall her lipids were not well controlled. We switched her from pravastatin to Crestor. Improve compliance recently with medication. No myalgias.  Hypertension. Stable. Takes telmisartan-HCTZ.   No dizziness. No headaches. No chest pains.  Past Medical History  Diagnosis Date  . Chicken pox   . Hypertension   . Hyperlipidemia   . Hepatitis   . Diabetes mellitus 1998    type ll   Past Surgical History  Procedure Laterality Date  . Cholecystectomy  1990  . Abdominal hysterectomy  1990  . Breast reduction surgery  1996    reports that she has never smoked. She has never used smokeless tobacco. She reports that she does not drink alcohol or use illicit drugs. family history includes Arthritis in her mother; Cancer in her sister; Cancer (age of onset: 6052) in her father; Diabetes in her maternal grandfather and mother; Hyperlipidemia in her mother; Hypertension in her mother; Stroke in her maternal grandmother. There is no history of Colon cancer or Stomach cancer. No Known Allergies    Review of Systems  Constitutional: Positive for fatigue. Negative for appetite change.  Eyes: Negative for visual disturbance.  Respiratory: Negative for cough, chest tightness, shortness of breath and wheezing.   Cardiovascular: Negative for chest pain, palpitations and leg swelling.  Endocrine: Negative for polydipsia and polyuria.  Neurological: Negative  for dizziness, seizures, syncope, weakness, light-headedness and headaches.       Objective:   Physical Exam  Constitutional: She appears well-developed and well-nourished.  Neck: Neck supple. No thyromegaly present.  Cardiovascular: Normal rate and regular rhythm.  Exam reveals no gallop.   Pulmonary/Chest: Effort normal and breath sounds normal. No respiratory distress. She has no wheezes. She has no rales.  Musculoskeletal: She exhibits no edema.  Skin:  Normal sensation with monofilament testing. She has small calluses balls of both feet. No skin lesions. Good dorsalis pedis pulses.          Assessment & Plan:  #1 type 2 diabetes. History of good control. Recent weight gain. Recheck A1c. We discussed stepping up her exercise and restrict overall calorie intake.  #2 dyslipidemia. Recent change to Crestor. No side effects. Repeat lipid and hepatic panel today  #3 hypertension. Stable and at goal. Continue current medication  #4 health maintenance. We addressed that she's not had previous Pneumovax. She wishes to wait till fall and consider when she gets her flu vaccine  Kristian CoveyBruce W Priscila Bean MD [facility]

## 2016-09-13 ENCOUNTER — Other Ambulatory Visit: Payer: Self-pay | Admitting: Family Medicine

## 2016-09-22 ENCOUNTER — Other Ambulatory Visit: Payer: Self-pay | Admitting: Obstetrics and Gynecology

## 2016-09-22 DIAGNOSIS — Z1231 Encounter for screening mammogram for malignant neoplasm of breast: Secondary | ICD-10-CM

## 2016-10-28 ENCOUNTER — Ambulatory Visit
Admission: RE | Admit: 2016-10-28 | Discharge: 2016-10-28 | Disposition: A | Discharge status: Home / Self Care | Payer: Commercial Managed Care - HMO | Source: Ambulatory Visit | Attending: Obstetrics and Gynecology | Admitting: Obstetrics and Gynecology

## 2016-10-28 DIAGNOSIS — Z1231 Encounter for screening mammogram for malignant neoplasm of breast: Secondary | ICD-10-CM

## 2016-11-03 ENCOUNTER — Encounter: Payer: Self-pay | Admitting: Family Medicine

## 2016-11-03 ENCOUNTER — Ambulatory Visit (INDEPENDENT_AMBULATORY_CARE_PROVIDER_SITE_OTHER): Payer: Commercial Managed Care - HMO | Admitting: Family Medicine

## 2016-11-03 VITALS — BP 134/84 | HR 86 | Temp 97.9°F | Ht 64.0 in | Wt 173.0 lb

## 2016-11-03 DIAGNOSIS — E119 Type 2 diabetes mellitus without complications: Secondary | ICD-10-CM | POA: Diagnosis not present

## 2016-11-03 DIAGNOSIS — E785 Hyperlipidemia, unspecified: Secondary | ICD-10-CM | POA: Diagnosis not present

## 2016-11-03 DIAGNOSIS — Z23 Encounter for immunization: Secondary | ICD-10-CM

## 2016-11-03 DIAGNOSIS — I10 Essential (primary) hypertension: Secondary | ICD-10-CM

## 2016-11-03 LAB — POCT GLYCOSYLATED HEMOGLOBIN (HGB A1C): Hemoglobin A1C: 6.7

## 2016-11-03 NOTE — Progress Notes (Signed)
Subjective:     Patient ID: Amanda Obrien, female   DOB: 1956-09-02, 60 y.o.   MRN: [id number]  HPI Here for medical follow-up. Her father-in-law died several months ago. They've been very busy trying to sort out things with his estate. Her chronic problems include type 2 diabetes, hyperlipidemia, hypertension. Compliant with medications but not compliant with diet or exercise. She's had some gradual weight gain past year. Not monitoring blood sugars regularly. Lipids were improved last visit. Her blood pressure is stable. No chest pains. No dizziness. No headaches. Still needs flu vaccination.  Past Medical History:  Diagnosis Date  . Chicken pox   . Diabetes mellitus 1998   type ll  . Hepatitis   . Hyperlipidemia   . Hypertension    Past Surgical History:  Procedure Laterality Date  . ABDOMINAL HYSTERECTOMY  1990  . BREAST REDUCTION SURGERY  1996  . CHOLECYSTECTOMY  1990    reports that she has never smoked. She has never used smokeless tobacco. She reports that she does not drink alcohol or use drugs. family history includes Arthritis in her mother; Cancer in her sister; Cancer (age of onset: 6452) in her father; Diabetes in her maternal grandfather and mother; Hyperlipidemia in her mother; Hypertension in her mother; Stroke in her maternal grandmother. No Known Allergies   Review of Systems  Constitutional: Positive for fatigue. Negative for unexpected weight change.  Eyes: Negative for visual disturbance.  Respiratory: Negative for cough, chest tightness, shortness of breath and wheezing.   Cardiovascular: Negative for chest pain, palpitations and leg swelling.  Endocrine: Negative for polydipsia and polyuria.  Neurological: Negative for dizziness, seizures, syncope, weakness, light-headedness and headaches.       Objective:   Physical Exam  Constitutional: She appears well-developed and well-nourished.  Eyes: Pupils are equal, round, and reactive to light.  Neck: Neck  supple. No JVD present. No thyromegaly present.  Cardiovascular: Normal rate and regular rhythm.  Exam reveals no gallop.   Pulmonary/Chest: Effort normal and breath sounds normal. No respiratory distress. She has no wheezes. She has no rales.  Musculoskeletal: She exhibits no edema.  Neurological: She is alert.       Assessment:     #1 hypertension stable and at goal #2 dyslipidemia #3 type 2 diabetes with history of good control    Plan:     -Flu vaccine given -Recheck hemoglobin A1c (6.7%) -Establish more consistent exercise. She is strongly advised to lose some weight  Kristian CoveyBruce W Rashelle Ireland MD [facility]

## 2016-11-03 NOTE — Progress Notes (Signed)
Pre visit review using our clinic review tool, if applicable. No additional management support is needed unless otherwise documented below in the visit note. 

## 2016-11-22 ENCOUNTER — Other Ambulatory Visit: Payer: Self-pay | Admitting: Family Medicine

## 2016-12-03 ENCOUNTER — Ambulatory Visit (INDEPENDENT_AMBULATORY_CARE_PROVIDER_SITE_OTHER): Payer: Commercial Managed Care - HMO | Admitting: Family Medicine

## 2016-12-03 VITALS — BP 148/80 | HR 93 | Temp 98.1°F | Ht 64.0 in | Wt 174.0 lb

## 2016-12-03 DIAGNOSIS — I10 Essential (primary) hypertension: Secondary | ICD-10-CM | POA: Diagnosis not present

## 2016-12-03 NOTE — Progress Notes (Signed)
Pre visit review using our clinic review tool, if applicable. No additional management support is needed unless otherwise documented below in the visit note. 

## 2016-12-03 NOTE — Progress Notes (Signed)
Subjective:     Patient ID: Amanda PutnamCathy W Malecki, female   DOB: 1956-04-24, 60 y.o.   MRN: [id number]  HPI Patient seen with elevated blood pressure. She has known hypertension and takes Micardis HCTZ 40/12.5 mg one half tablet daily. She had some mild headache couple days ago and yesterday took blood pressure and had several readings that were elevated including 165/86, 173/96, 161/85. Blood pressure somewhat improved today but still high for her. Consistent with medication. No alcohol use. Rare use of Aleve.  Past Medical History:  Diagnosis Date  . Chicken pox   . Diabetes mellitus 1998   type ll  . Hepatitis   . Hyperlipidemia   . Hypertension    Past Surgical History:  Procedure Laterality Date  . ABDOMINAL HYSTERECTOMY  1990  . BREAST REDUCTION SURGERY  1996  . CHOLECYSTECTOMY  1990    reports that she has never smoked. She has never used smokeless tobacco. She reports that she does not drink alcohol or use drugs. family history includes Arthritis in her mother; Cancer in her sister; Cancer (age of onset: 6952) in her father; Diabetes in her maternal grandfather and mother; Hyperlipidemia in her mother; Hypertension in her mother; Stroke in her maternal grandmother. No Known Allergies   Review of Systems  Constitutional: Negative for fatigue.  Eyes: Negative for visual disturbance.  Respiratory: Negative for cough, chest tightness, shortness of breath and wheezing.   Cardiovascular: Negative for chest pain, palpitations and leg swelling.  Neurological: Negative for dizziness, seizures, syncope, weakness and light-headedness.       Objective:   Physical Exam  Constitutional: She appears well-developed and well-nourished.  Eyes: Pupils are equal, round, and reactive to light.  Neck: Neck supple. No JVD present. No thyromegaly present.  Cardiovascular: Normal rate and regular rhythm.  Exam reveals no gallop.   Pulmonary/Chest: Effort normal and breath sounds normal. No respiratory  distress. She has no wheezes. She has no rales.  Musculoskeletal: She exhibits no edema.  Neurological: She is alert.       Assessment:     Hypertension. Slight increase in readings past couple days    Plan:     -Watch sodium intake and try to keep less than 3000 mg daily -Increase Micardis HCTZ back to one full tablet daily -Lose some weight -More consistent aerobic exercise such as walking  Kristian CoveyBruce W Sharhonda Atwood MD [facility]

## 2016-12-03 NOTE — Patient Instructions (Signed)
Increase Micardis HCTZ back to one tablet once daily Keep sodium intake < 3000 mg per day Lose some weight.  Let me know in 2 weeks IF BP consistently 140/90.

## 2016-12-29 ENCOUNTER — Other Ambulatory Visit: Payer: Self-pay | Admitting: Family Medicine

## 2016-12-29 DIAGNOSIS — E119 Type 2 diabetes mellitus without complications: Secondary | ICD-10-CM | POA: Diagnosis not present

## 2017-03-02 ENCOUNTER — Ambulatory Visit (INDEPENDENT_AMBULATORY_CARE_PROVIDER_SITE_OTHER): Payer: Commercial Managed Care - HMO | Admitting: Family Medicine

## 2017-03-02 ENCOUNTER — Encounter: Payer: Self-pay | Admitting: Family Medicine

## 2017-03-02 VITALS — BP 120/74 | HR 71 | Temp 98.0°F | Wt 174.6 lb

## 2017-03-02 DIAGNOSIS — E119 Type 2 diabetes mellitus without complications: Secondary | ICD-10-CM

## 2017-03-02 DIAGNOSIS — I10 Essential (primary) hypertension: Secondary | ICD-10-CM

## 2017-03-02 DIAGNOSIS — E785 Hyperlipidemia, unspecified: Secondary | ICD-10-CM

## 2017-03-02 LAB — HEPATIC FUNCTION PANEL
ALBUMIN: 4.2 g/dL (ref 3.5–5.2)
ALT: 23 U/L (ref 0–35)
AST: 19 U/L (ref 0–37)
Alkaline Phosphatase: 77 U/L (ref 39–117)
Bilirubin, Direct: 0.1 mg/dL (ref 0.0–0.3)
Total Bilirubin: 0.6 mg/dL (ref 0.2–1.2)
Total Protein: 6.7 g/dL (ref 6.0–8.3)

## 2017-03-02 LAB — BASIC METABOLIC PANEL
BUN: 18 mg/dL (ref 6–23)
CHLORIDE: 102 meq/L (ref 96–112)
CO2: 29 mEq/L (ref 19–32)
Calcium: 9.8 mg/dL (ref 8.4–10.5)
Creatinine, Ser: 1.03 mg/dL (ref 0.40–1.20)
GFR: 58.02 mL/min — ABNORMAL LOW (ref >60.00)
Glucose, Bld: 165 mg/dL — ABNORMAL HIGH (ref 70–99)
POTASSIUM: 4 meq/L (ref 3.5–5.1)
Sodium: 141 mEq/L (ref 135–145)

## 2017-03-02 LAB — MICROALBUMIN / CREATININE URINE RATIO
CREATININE, U: 69.9 mg/dL
MICROALB UR: 0.7 mg/dL (ref 0.0–1.9)
Microalb Creat Ratio: 1 mg/g (ref 0.0–30.0)

## 2017-03-02 LAB — LIPID PANEL
CHOLESTEROL: 138 mg/dL (ref 0–200)
HDL: 37.5 mg/dL — ABNORMAL LOW (ref >39.00)
LDL CALC: 65 mg/dL (ref 0–99)
NonHDL: 100.32
Total CHOL/HDL Ratio: 4
Triglycerides: 175 mg/dL — ABNORMAL HIGH (ref 0.0–149.0)
VLDL: 35 mg/dL (ref 0.0–40.0)

## 2017-03-02 LAB — HEMOGLOBIN A1C: HEMOGLOBIN A1C: 7.4 % — AB (ref 4.6–6.5)

## 2017-03-02 NOTE — Progress Notes (Signed)
Pre visit review using our clinic review tool, if applicable. No additional management support is needed unless otherwise documented below in the visit note. 

## 2017-03-02 NOTE — Progress Notes (Signed)
Subjective:     Patient ID: Amanda Obrien, female   DOB: 1956-03-28, 61 y.o.   MRN: [id number]  HPI Patient's seen for routine medical follow-up  Hypertension. Elevated last visit. We increased her Micardis- HCTZ to 1 full tablet daily. Blood pressure improved since then. No headaches. No chest pains. Recently started a new yoga class. Not tracking calories.  Type 2 diabetes. Last A1c 6.7%. No polyuria or polydipsia. Remains on metformin. She had eye exam in January with no retinopathy or other concerns.  Hyperlipidemia treated with Crestor. No myalgias. Compliant with therapy. No cardiac history.  Past Medical History:  Diagnosis Date  . Chicken pox   . Diabetes mellitus 1998   type ll  . Hepatitis   . Hyperlipidemia   . Hypertension    Past Surgical History:  Procedure Laterality Date  . ABDOMINAL HYSTERECTOMY  1990  . BREAST REDUCTION SURGERY  1996  . CHOLECYSTECTOMY  1990    reports that she has never smoked. She has never used smokeless tobacco. She reports that she does not drink alcohol or use drugs. family history includes Arthritis in her mother; Cancer in her sister; Cancer (age of onset: 6852) in her father; Diabetes in her maternal grandfather and mother; Hyperlipidemia in her mother; Hypertension in her mother; Stroke in her maternal grandmother. No Known Allergies   Review of Systems  Constitutional: Negative for activity change, appetite change, fatigue, fever and unexpected weight change.  HENT: Negative for ear pain, hearing loss, sore throat and trouble swallowing.   Eyes: Negative for visual disturbance.  Respiratory: Negative for cough and shortness of breath.   Cardiovascular: Negative for chest pain and palpitations.  Gastrointestinal: Negative for abdominal pain, blood in stool, constipation and diarrhea.  Endocrine: Negative for polydipsia and polyuria.  Genitourinary: Negative for dysuria and hematuria.  Musculoskeletal: Negative for arthralgias, back  pain and myalgias.  Skin: Negative for rash.  Neurological: Negative for dizziness, syncope and headaches.  Hematological: Negative for adenopathy.  Psychiatric/Behavioral: Negative for confusion and dysphoric mood.       Objective:   Physical Exam  Constitutional: She appears well-developed and well-nourished.  Eyes: Pupils are equal, round, and reactive to light.  Neck: Neck supple. No JVD present. No thyromegaly present.  Cardiovascular: Normal rate and regular rhythm.  Exam reveals no gallop.   Pulmonary/Chest: Effort normal and breath sounds normal. No respiratory distress. She has no wheezes. She has no rales.  Musculoskeletal: She exhibits no edema.  Neurological: She is alert.       Assessment:     #1 type 2 diabetes. History of good control  #2 hypertension improved and now at goal  #3 dyslipidemia    Plan:     -Check labs with lipid panel, hepatic panel, basic metabolic panel, hemoglobin A1c, urine microalbumin screen -She is strongly challenged to lose some weight and establish more consistent exercise -We recommended she consider such as "my fitness pal" to help track calorie intake -We recommended Pneumovax but she declines at this time. -Routine follow-up in 6 months and sooner as needed  Amanda CoveyBruce W Delrico Minehart MD [facility]

## 2017-04-11 ENCOUNTER — Telehealth: Payer: Self-pay | Admitting: Family Medicine

## 2017-04-11 MED ORDER — GLUCOSE BLOOD VI STRP: ORAL_STRIP | 2 refills | Status: DC

## 2017-04-11 NOTE — Telephone Encounter (Signed)
Pt request refill   ONE TOUCH ULTRA TEST test strip  [id number] - [place], [place] - [address]  Pt is out and has been using her husband.  Forgot to ask last time she was here!

## 2017-06-06 ENCOUNTER — Ambulatory Visit (INDEPENDENT_AMBULATORY_CARE_PROVIDER_SITE_OTHER): Payer: 59 | Admitting: Family Medicine

## 2017-06-06 ENCOUNTER — Encounter: Payer: Self-pay | Admitting: Family Medicine

## 2017-06-06 DIAGNOSIS — E119 Type 2 diabetes mellitus without complications: Secondary | ICD-10-CM | POA: Diagnosis not present

## 2017-06-06 LAB — POCT GLYCOSYLATED HEMOGLOBIN (HGB A1C): Hemoglobin A1C: 6.6

## 2017-06-06 NOTE — Patient Instructions (Signed)
Congratulations with the weight loss Let's plan on 3 month follow up.

## 2017-06-06 NOTE — Progress Notes (Signed)
Subjective:     Patient ID: Amanda PutnamCathy W Kester, female   DOB: [birth year], 61 y.o.   MRN: [id number]  HPI Patient has history of obesity, hyperlipidemia, hypertension, type 2 diabetes. Last A1c back in March 7.4%. She has made some adjustments with diet.  No change in activity levels. Still has occasional fasting blood sugars up 250. She takes metformin 500 milligrams twice daily. Overall feels well. Weight down about 9 pounds from last visit and this is intentional weight loss  Lab Results  Component Value Date   HGBA1C 6.6 06/06/2017     Past Medical History:  Diagnosis Date  . Chicken pox   . Diabetes mellitus 1998   type ll  . Hepatitis   . Hyperlipidemia   . Hypertension    Past Surgical History:  Procedure Laterality Date  . ABDOMINAL HYSTERECTOMY  1990  . BREAST REDUCTION SURGERY  1996  . CHOLECYSTECTOMY  1990    reports that she has never smoked. She has never used smokeless tobacco. She reports that she does not drink alcohol or use drugs. family history includes Arthritis in her mother; Cancer in her sister; Cancer (age of onset: 152) in her father; Diabetes in her maternal grandfather and mother; Hyperlipidemia in her mother; Hypertension in her mother; Stroke in her maternal grandmother. No Known Allergies   Review of Systems  Constitutional: Negative for fatigue.  Eyes: Negative for visual disturbance.  Respiratory: Negative for cough, chest tightness, shortness of breath and wheezing.   Cardiovascular: Negative for chest pain, palpitations and leg swelling.  Endocrine: Negative for polydipsia and polyuria.  Neurological: Negative for dizziness, seizures, syncope, weakness, light-headedness and headaches.       Objective:   Physical Exam  Constitutional: She appears well-developed and well-nourished.  Eyes: Pupils are equal, round, and reactive to light.  Neck: Neck supple. No JVD present. No thyromegaly present.  Cardiovascular: Normal rate and regular rhythm.   Exam reveals no gallop.   Pulmonary/Chest: Effort normal and breath sounds normal. No respiratory distress. She has no wheezes. She has no rales.  Musculoskeletal: She exhibits no edema.  Neurological: She is alert.       Assessment:     Type 2 diabetes-improved and now at goal A1c today 6.6%    Plan:     -congratulated on weight loss -Continue low glycemic diet and continue weight loss efforts. Would like to see her get down around 150 pounds or less -Routine follow-up in 3 months  Kristian CoveyBruce W Burchette MD [facility]

## 2017-07-11 ENCOUNTER — Other Ambulatory Visit: Payer: Self-pay | Admitting: Family Medicine

## 2017-08-01 ENCOUNTER — Other Ambulatory Visit: Payer: Self-pay | Admitting: Family Medicine

## 2017-08-04 ENCOUNTER — Other Ambulatory Visit: Payer: Self-pay | Admitting: Family Medicine

## 2017-08-05 ENCOUNTER — Telehealth: Payer: Self-pay

## 2017-08-05 NOTE — Telephone Encounter (Signed)
Received PA request for one touch ultra test strips. PA submitted & pending. Key: [id number]

## 2017-08-12 ENCOUNTER — Other Ambulatory Visit: Payer: Self-pay | Admitting: *Deleted

## 2017-08-12 MED ORDER — GLUCOSE BLOOD VI STRP: ORAL_STRIP | 11 refills | Status: DC

## 2017-08-12 MED ORDER — ONETOUCH ULTRASOFT LANCETS MISC: 11 refills | Status: AC

## 2017-08-31 ENCOUNTER — Encounter: Payer: Self-pay | Admitting: Family Medicine

## 2017-08-31 ENCOUNTER — Ambulatory Visit (INDEPENDENT_AMBULATORY_CARE_PROVIDER_SITE_OTHER): Payer: 59 | Admitting: Family Medicine

## 2017-08-31 VITALS — BP 110/70 | HR 76 | Temp 98.3°F | Wt 168.5 lb

## 2017-08-31 DIAGNOSIS — E119 Type 2 diabetes mellitus without complications: Secondary | ICD-10-CM

## 2017-08-31 DIAGNOSIS — I10 Essential (primary) hypertension: Secondary | ICD-10-CM | POA: Diagnosis not present

## 2017-08-31 DIAGNOSIS — E785 Hyperlipidemia, unspecified: Secondary | ICD-10-CM

## 2017-08-31 LAB — POCT GLYCOSYLATED HEMOGLOBIN (HGB A1C): Hemoglobin A1C: 6.9

## 2017-08-31 NOTE — Progress Notes (Signed)
Subjective:     Patient ID: Amanda PutnamCathy W Qian, female   DOB: 22-Nov-1956, 61 y.o.   MRN: [id number]  HPI Patient seen for medical follow-up. Her chronic problems include history of obesity, type 2 diabetes, hypertension, hyperlipidemia. Last A1c 6.6%. She has gained a few pounds since then. Her medications include Telmisartan HCTZ, Crestor, and metformin.  Compliant with medications. Denies any side effects. Poor compliance with diet at times and not consistently exercising. We challenged her last visit try to get her weight down below 150 pounds as a goal.  She plans he had flu vaccine later this fall in October. Denies any recent chest pains. No dizziness. No headaches.  Past Medical History:  Diagnosis Date  . Chicken pox   . Diabetes mellitus 1998   type ll  . Hepatitis   . Hyperlipidemia   . Hypertension    Past Surgical History:  Procedure Laterality Date  . ABDOMINAL HYSTERECTOMY  1990  . BREAST REDUCTION SURGERY  1996  . CHOLECYSTECTOMY  1990    reports that she has never smoked. She has never used smokeless tobacco. She reports that she does not drink alcohol or use drugs. family history includes Arthritis in her mother; Cancer in her sister; Cancer (age of onset: 5052) in her father; Diabetes in her maternal grandfather and mother; Hyperlipidemia in her mother; Hypertension in her mother; Stroke in her maternal grandmother. No Known Allergies   Review of Systems  Constitutional: Negative for fatigue.  Eyes: Negative for visual disturbance.  Respiratory: Negative for cough, chest tightness, shortness of breath and wheezing.   Cardiovascular: Negative for chest pain, palpitations and leg swelling.  Gastrointestinal: Negative for abdominal pain.  Endocrine: Negative for polydipsia and polyuria.  Genitourinary: Negative for dysuria.  Neurological: Negative for dizziness, seizures, syncope, weakness, light-headedness and headaches.       Objective:   Physical Exam   Constitutional: She appears well-developed and well-nourished.  Eyes: Pupils are equal, round, and reactive to light.  Neck: Neck supple. No JVD present. No thyromegaly present.  Cardiovascular: Normal rate and regular rhythm.  Exam reveals no gallop.   Pulmonary/Chest: Effort normal and breath sounds normal. No respiratory distress. She has no wheezes. She has no rales.  Musculoskeletal: She exhibits no edema.  Neurological: She is alert.       Assessment:     #1 type 2 diabetes with hemoglobin A1c today of 6.9%  #2 hypertension stable and at goal  #3 dyslipidemia on Crestor    Plan:     -Establish more consistent exercise -We've again challenged her to try to lose some weight down below 150 pounds -Reminder for flu vaccine later this fall -Routine follow-up in 6 months.  Kristian CoveyBruce W Sampson Self MD [facility]

## 2017-09-02 ENCOUNTER — Ambulatory Visit: Payer: Commercial Managed Care - HMO | Admitting: Family Medicine

## 2017-09-06 ENCOUNTER — Ambulatory Visit: Payer: 59 | Admitting: Family Medicine

## 2017-09-08 ENCOUNTER — Encounter: Payer: Self-pay | Admitting: Family Medicine

## 2017-09-26 ENCOUNTER — Other Ambulatory Visit: Payer: Self-pay | Admitting: Obstetrics and Gynecology

## 2017-09-26 DIAGNOSIS — Z1231 Encounter for screening mammogram for malignant neoplasm of breast: Secondary | ICD-10-CM

## 2017-09-26 DIAGNOSIS — Z01419 Encounter for gynecological examination (general) (routine) without abnormal findings: Secondary | ICD-10-CM | POA: Diagnosis not present

## 2017-09-26 DIAGNOSIS — Z23 Encounter for immunization: Secondary | ICD-10-CM | POA: Diagnosis not present

## 2017-09-29 ENCOUNTER — Other Ambulatory Visit: Payer: Self-pay | Admitting: Family Medicine

## 2017-10-31 ENCOUNTER — Ambulatory Visit
Admission: RE | Admit: 2017-10-31 | Discharge: 2017-10-31 | Disposition: A | Discharge status: Home / Self Care | Payer: 59 | Source: Ambulatory Visit | Attending: Obstetrics and Gynecology | Admitting: Obstetrics and Gynecology

## 2017-10-31 DIAGNOSIS — Z1231 Encounter for screening mammogram for malignant neoplasm of breast: Secondary | ICD-10-CM | POA: Diagnosis not present

## 2017-11-12 ENCOUNTER — Other Ambulatory Visit: Payer: Self-pay | Admitting: Family Medicine

## 2018-01-03 ENCOUNTER — Other Ambulatory Visit: Payer: Self-pay | Admitting: Family Medicine

## 2018-01-11 DIAGNOSIS — E119 Type 2 diabetes mellitus without complications: Secondary | ICD-10-CM | POA: Diagnosis not present

## 2018-01-11 LAB — HM DIABETES EYE EXAM

## 2018-01-13 ENCOUNTER — Encounter: Payer: Self-pay | Admitting: Family Medicine

## 2018-02-28 ENCOUNTER — Ambulatory Visit: Payer: 59 | Admitting: Family Medicine

## 2018-02-28 ENCOUNTER — Encounter: Payer: Self-pay | Admitting: Family Medicine

## 2018-02-28 VITALS — BP 110/70 | HR 71 | Temp 98.1°F | Wt 160.1 lb

## 2018-02-28 DIAGNOSIS — E119 Type 2 diabetes mellitus without complications: Secondary | ICD-10-CM

## 2018-02-28 DIAGNOSIS — E785 Hyperlipidemia, unspecified: Secondary | ICD-10-CM

## 2018-02-28 DIAGNOSIS — I10 Essential (primary) hypertension: Secondary | ICD-10-CM | POA: Diagnosis not present

## 2018-02-28 LAB — HEPATIC FUNCTION PANEL
ALBUMIN: 4.4 g/dL (ref 3.5–5.2)
ALT: 16 U/L (ref 0–35)
AST: 14 U/L (ref 0–37)
Alkaline Phosphatase: 77 U/L (ref 39–117)
BILIRUBIN TOTAL: 0.6 mg/dL (ref 0.2–1.2)
Bilirubin, Direct: 0.1 mg/dL (ref 0.0–0.3)
Total Protein: 7.1 g/dL (ref 6.0–8.3)

## 2018-02-28 LAB — BASIC METABOLIC PANEL
BUN: 17 mg/dL (ref 6–23)
CHLORIDE: 101 meq/L (ref 96–112)
CO2: 31 mEq/L (ref 19–32)
Calcium: 10 mg/dL (ref 8.4–10.5)
Creatinine, Ser: 0.84 mg/dL (ref 0.40–1.20)
GFR: 73.17 mL/min (ref >60.00)
Glucose, Bld: 134 mg/dL — ABNORMAL HIGH (ref 70–99)
POTASSIUM: 3.8 meq/L (ref 3.5–5.1)
Sodium: 142 mEq/L (ref 135–145)

## 2018-02-28 LAB — LIPID PANEL
CHOLESTEROL: 115 mg/dL (ref 0–200)
HDL: 36 mg/dL — AB (ref >39.00)
LDL CALC: 41 mg/dL (ref 0–99)
NonHDL: 79.18
TRIGLYCERIDES: 192 mg/dL — AB (ref 0.0–149.0)
Total CHOL/HDL Ratio: 3
VLDL: 38.4 mg/dL (ref 0.0–40.0)

## 2018-02-28 LAB — POCT GLYCOSYLATED HEMOGLOBIN (HGB A1C): Hemoglobin A1C: 6.6

## 2018-02-28 MED ORDER — METFORMIN HCL 1000 MG PO TABS: 500.0000 mg | ORAL_TABLET | Freq: Two times a day (BID) | ORAL | 3 refills | Status: DC

## 2018-02-28 MED ORDER — TELMISARTAN-HCTZ 40-12.5 MG PO TABS: 1.0000 | ORAL_TABLET | Freq: Every day | ORAL | 3 refills | Status: DC

## 2018-02-28 MED ORDER — ROSUVASTATIN CALCIUM 10 MG PO TABS: 10.0000 mg | ORAL_TABLET | Freq: Every day | ORAL | 3 refills | Status: DC

## 2018-02-28 NOTE — Progress Notes (Signed)
Subjective:     Patient ID: Amanda PutnamCathy W Bow, female   DOB: [birth year], 62 y.o.   MRN: [id number]  HPI Patient seen for medical follow-up. She has done excellent job with restricting carbs since last visit and has lost 8 pounds. She has type 2 diabetes, hyperlipidemia, hypertension. Medications reviewed. Denies any headaches or dizziness. No chest pains. No polyuria or polydipsia. Compliant with all medications.  Past Medical History:  Diagnosis Date  . Chicken pox   . Diabetes mellitus 1998   type ll  . Hepatitis   . Hyperlipidemia   . Hypertension    Past Surgical History:  Procedure Laterality Date  . ABDOMINAL HYSTERECTOMY  1990  . BREAST REDUCTION SURGERY  1996  . CHOLECYSTECTOMY  1990    reports that  has never smoked. she has never used smokeless tobacco. She reports that she does not drink alcohol or use drugs. family history includes Arthritis in her mother; Cancer in her sister; Cancer (age of onset: 2252) in her father; Diabetes in her maternal grandfather and mother; Hyperlipidemia in her mother; Hypertension in her mother; Stroke in her maternal grandmother. No Known Allergies   Review of Systems  Constitutional: Negative for fatigue.  Eyes: Negative for visual disturbance.  Respiratory: Negative for cough, chest tightness, shortness of breath and wheezing.   Cardiovascular: Negative for chest pain, palpitations and leg swelling.  Endocrine: Negative for polydipsia and polyuria.  Neurological: Negative for dizziness, seizures, syncope, weakness, light-headedness and headaches.       Objective:   Physical Exam  Constitutional: She appears well-developed and well-nourished.  Eyes: Pupils are equal, round, and reactive to light.  Neck: Neck supple. No JVD present. No thyromegaly present.  Cardiovascular: Normal rate and regular rhythm. Exam reveals no gallop.  Pulmonary/Chest: Effort normal and breath sounds normal. No respiratory distress. She has no wheezes. She has  no rales.  Musculoskeletal: She exhibits no edema.  Neurological: She is alert.       Assessment:     #1 hypertension stable and at goal  #2 type 2 diabetes well controlled with A1c 6.6%  #3 hyperlipidemia    Plan:     -Check labs with hepatic panel, lipid panel, basic metabolic panel, A1c -Refilled all regular medications for one year -Continue weight loss efforts and reassess in 6 months  Kristian CoveyBruce W Jayland Null MD [facility]

## 2018-09-01 ENCOUNTER — Ambulatory Visit: Payer: 59 | Admitting: Family Medicine

## 2018-09-06 ENCOUNTER — Other Ambulatory Visit: Payer: Self-pay

## 2018-09-06 ENCOUNTER — Encounter: Payer: Self-pay | Admitting: Family Medicine

## 2018-09-06 ENCOUNTER — Ambulatory Visit: Payer: 59 | Admitting: Family Medicine

## 2018-09-06 VITALS — BP 136/78 | HR 78 | Temp 98.0°F | Resp 16 | Ht 64.0 in | Wt 166.1 lb

## 2018-09-06 DIAGNOSIS — E785 Hyperlipidemia, unspecified: Secondary | ICD-10-CM | POA: Diagnosis not present

## 2018-09-06 DIAGNOSIS — Z23 Encounter for immunization: Secondary | ICD-10-CM | POA: Diagnosis not present

## 2018-09-06 DIAGNOSIS — E119 Type 2 diabetes mellitus without complications: Secondary | ICD-10-CM

## 2018-09-06 DIAGNOSIS — I10 Essential (primary) hypertension: Secondary | ICD-10-CM | POA: Diagnosis not present

## 2018-09-06 LAB — POCT GLYCOSYLATED HEMOGLOBIN (HGB A1C): Hemoglobin A1C: 6.7 % — AB (ref 4.0–5.6)

## 2018-09-06 NOTE — Progress Notes (Signed)
  Subjective:     Patient ID: Amanda PutnamCathy W Reynolds, female   DOB: [birth year], 62 y.o.   MRN: [id number]  HPI Patient is seen for medical follow-up. She has history of type 2 diabetes, hyperlipidemia, hypertension. Blood sugars been fairly well controlled. No recent polyuria or polydipsia. She remains on metformin for diabetes. Checks Crestor for hyperlipidemia. She is on Micardis HCTZ for hypertension. Denies any recent headaches, dizziness, chest pains.  She is overdue for tetanus. Also needs flu vaccine. She declines Pneumovax. She's had prior hysterectomy and sees gynecologist and apparently still getting Pap smears every 5 years  Past Medical History:  Diagnosis Date  . Chicken pox   . Diabetes mellitus 1998   type ll  . Hepatitis   . Hyperlipidemia   . Hypertension    Past Surgical History:  Procedure Laterality Date  . ABDOMINAL HYSTERECTOMY  1990  . BREAST REDUCTION SURGERY  1996  . CHOLECYSTECTOMY  1990    reports that she has never smoked. She has never used smokeless tobacco. She reports that she does not drink alcohol or use drugs. family history includes Arthritis in her mother; Cancer in her sister; Cancer (age of onset: 6752) in her father; Diabetes in her maternal grandfather and mother; Hyperlipidemia in her mother; Hypertension in her mother; Stroke in her maternal grandmother. No Known Allergies   Review of Systems  Constitutional: Negative for fatigue.  Eyes: Negative for visual disturbance.  Respiratory: Negative for cough, chest tightness, shortness of breath and wheezing.   Cardiovascular: Negative for chest pain, palpitations and leg swelling.  Endocrine: Negative for polydipsia and polyuria.  Neurological: Negative for dizziness, seizures, syncope, weakness, light-headedness and headaches.       Objective:   Physical Exam  Constitutional: She appears well-developed and well-nourished.  Eyes: Pupils are equal, round, and reactive to light.  Neck: Neck supple.  No JVD present. No thyromegaly present.  Cardiovascular: Normal rate and regular rhythm. Exam reveals no gallop.  Pulmonary/Chest: Effort normal and breath sounds normal. No respiratory distress. She has no wheezes. She has no rales.  Musculoskeletal: She exhibits no edema.  Neurological: She is alert.       Assessment:     #1 hypertension stable  #2 history of dyslipidemia  #3 type 2 diabetes fairly well controlled with A1c today 6.7%    Plan:     -continue current medications. -Flu vaccine and tetanus booster given -Recommend routine follow-up in 6 months consider as needed -She is encouraged to engage in regular exercise and try to lose a few pounds  Kristian CoveyBruce W Lucyle Alumbaugh MD [facility]

## 2018-09-09 ENCOUNTER — Encounter (HOSPITAL_COMMUNITY): Payer: Self-pay | Admitting: *Deleted

## 2018-09-09 ENCOUNTER — Emergency Department (HOSPITAL_COMMUNITY): Payer: 59

## 2018-09-09 ENCOUNTER — Emergency Department (HOSPITAL_COMMUNITY)
Admission: EM | Admit: 2018-09-09 | Discharge: 2018-09-09 | Disposition: A | Discharge status: Home / Self Care | Payer: 59 | Attending: Emergency Medicine | Admitting: Emergency Medicine

## 2018-09-09 ENCOUNTER — Ambulatory Visit: Payer: 59 | Admitting: Family Medicine

## 2018-09-09 DIAGNOSIS — Z79899 Other long term (current) drug therapy: Secondary | ICD-10-CM | POA: Diagnosis not present

## 2018-09-09 DIAGNOSIS — E119 Type 2 diabetes mellitus without complications: Secondary | ICD-10-CM | POA: Insufficient documentation

## 2018-09-09 DIAGNOSIS — G510 Bell's palsy: Secondary | ICD-10-CM | POA: Diagnosis not present

## 2018-09-09 DIAGNOSIS — I10 Essential (primary) hypertension: Secondary | ICD-10-CM | POA: Insufficient documentation

## 2018-09-09 DIAGNOSIS — R29810 Facial weakness: Secondary | ICD-10-CM | POA: Diagnosis present

## 2018-09-09 DIAGNOSIS — R4020 Unspecified coma: Secondary | ICD-10-CM | POA: Diagnosis not present

## 2018-09-09 DIAGNOSIS — Z7984 Long term (current) use of oral hypoglycemic drugs: Secondary | ICD-10-CM | POA: Diagnosis not present

## 2018-09-09 MED ORDER — FAMCICLOVIR 500 MG PO TABS: 500.0000 mg | ORAL_TABLET | Freq: Three times a day (TID) | ORAL | 0 refills | Status: DC

## 2018-09-09 NOTE — ED Triage Notes (Signed)
Pt noticed right sided facial drooping and tingling since last night. Pt states she had Bell's palsy in the past and had similar symptoms. No facial droop, arm drift or slurred speech noted.

## 2018-09-09 NOTE — Discharge Instructions (Addendum)
Follow-up with your doctor later this week for recheck. °

## 2018-09-12 NOTE — ED Provider Notes (Signed)
[facility]-EMERGENCY DEPT Provider Note   CSN: [id number] Arrival date & time: 09/09/18  1159     History   Chief Complaint Chief Complaint  Patient presents with  . Facial Droop    HPI Amanda Obrien is a 62 y.o. female.  Patient states she started yesterday with mild right side facial droop.  She has a history of Bell's palsy.  She states this is similar but not as bad  The history is provided by the patient. No language interpreter was used.  Illness  This is a new problem. The current episode started 3 to 5 hours ago. The problem occurs constantly. The problem has not changed since onset.Pertinent negatives include no chest pain, no abdominal pain and no headaches. Nothing aggravates the symptoms. Nothing relieves the symptoms. She has tried nothing for the symptoms. The treatment provided no relief.    Past Medical History:  Diagnosis Date  . Chicken pox   . Diabetes mellitus 1998   type ll  . Hepatitis   . Hyperlipidemia   . Hypertension     Patient Active Problem List   Diagnosis Date Noted  . Type 2 diabetes mellitus, controlled (HCC) 06/24/2011  . Hypertension 06/24/2011  . Hyperlipemia 06/24/2011    Past Surgical History:  Procedure Laterality Date  . ABDOMINAL HYSTERECTOMY  1990  . BREAST REDUCTION SURGERY  1996  . CHOLECYSTECTOMY  1990     OB History   None      Home Medications    Prior to Admission medications   Medication Sig Start Date End Date Taking? Authorizing Provider  calcium carbonate (OS-CAL) 600 MG TABS Take 600 mg by mouth 2 (two) times daily with a meal.   Yes [provider]  cholecalciferol (VITAMIN D) 1000 UNITS tablet Take 1,000 Units by mouth daily.   Yes [provider]  metFORMIN (GLUCOPHAGE) 1000 MG tablet Take 0.5 tablets (500 mg total) by mouth 2 (two) times daily. 02/28/18  Yes Burchette, Elberta Fortis, MD  Multiple Vitamin (MULTIVITAMIN) tablet Take 1 tablet by mouth daily.   Yes  [provider]  PREMARIN vaginal cream Place 1 Applicatorful vaginally every three (3) days as needed (menopause).  10/27/15  Yes [provider]  rosuvastatin (CRESTOR) 10 MG tablet Take 1 tablet (10 mg total) by mouth daily. 02/28/18  Yes Burchette, Elberta Fortis, MD  telmisartan-hydrochlorothiazide (MICARDIS HCT) 40-12.5 MG tablet Take 1 tablet by mouth daily. 02/28/18  Yes Burchette, Elberta Fortis, MD  famciclovir (FAMVIR) 500 MG tablet Take 1 tablet (500 mg total) by mouth 3 (three) times daily. 09/09/18   Bethann Berkshire, MD  glucose blood (ONE TOUCH ULTRA TEST) test strip Test once daily Patient taking differently: 3-5 each by Other route as needed (blood sugar). Test three to five times daily  Dx E11.9 08/12/17   [name], MD  Lancets Letta Pate ULTRASOFT) lancets Test once daily Dx e11.9 08/12/17   [name], MD    Family History Family History  Problem Relation Age of Onset  . Arthritis Mother   . Hyperlipidemia Mother   . Hypertension Mother   . Diabetes Mother        type ll  . Cancer Father 79       bladder and lymphoma  . Cancer Sister   . Stroke Maternal Grandmother   . Diabetes Maternal Grandfather        type ll  . Colon cancer Neg Hx   . Stomach cancer  Neg Hx     Social History Social History   Tobacco Use  . Smoking status: Never Smoker  . Smokeless tobacco: Never Used  Substance Use Topics  . Alcohol use: No  . Drug use: No     Allergies   Patient has no known allergies.   Review of Systems Review of Systems  Constitutional: Negative for appetite change and fatigue.  HENT: Negative for congestion, ear discharge and sinus pressure.        Right-sided facial droop  Eyes: Negative for discharge.  Respiratory: Negative for cough.   Cardiovascular: Negative for chest pain.  Gastrointestinal: Negative for abdominal pain and diarrhea.  Genitourinary: Negative for frequency and hematuria.  Musculoskeletal: Negative for back pain.    Skin: Negative for rash.  Neurological: Negative for seizures and headaches.  Psychiatric/Behavioral: Negative for hallucinations.     Physical Exam Updated Vital Signs BP (!) 148/71 (BP Location: Left Arm)   Pulse 80   Temp 98.2 F (36.8 C) (Oral)   Resp 16   SpO2 100%   Physical Exam  Constitutional: She is oriented to person, place, and time. She appears well-developed.  HENT:  Head: Normocephalic.  Patient with very minimal right cheek and upper lip decreased strength and movement  Eyes: Conjunctivae and EOM are normal. No scleral icterus.  Neck: Neck supple. No thyromegaly present.  Cardiovascular: Normal rate and regular rhythm. Exam reveals no gallop and no friction rub.  No murmur heard. Pulmonary/Chest: No stridor. She has no wheezes. She has no rales. She exhibits no tenderness.  Abdominal: She exhibits no distension. There is no tenderness. There is no rebound.  Musculoskeletal: Normal range of motion. She exhibits no edema.  Lymphadenopathy:    She has no cervical adenopathy.  Neurological: She is oriented to person, place, and time. She exhibits normal muscle tone. Coordination normal.  Skin: No rash noted. No erythema.  Psychiatric: She has a normal mood and affect. Her behavior is normal.     ED Treatments / Results  Labs (all labs ordered are listed, but only abnormal results are displayed) Labs Reviewed - No data to display  EKG None  Radiology No results found.  Procedures Procedures (including critical care time)  Medications Ordered in ED Medications - No data to display   Initial Impression / Assessment and Plan / ED Course  I have reviewed the triage vital signs and the nursing notes.  Pertinent labs & imaging results that were available during my care of the patient were reviewed by me and considered in my medical decision making (see chart for details).     CT scan unremarkable.  Possible very mild right-sided Bell's palsy.   Patient will be started on some Famvir and follow-up with her PCP  Final Clinical Impressions(s) / ED Diagnoses   Final diagnoses:  Bell's palsy    ED Discharge Orders         Ordered    famciclovir (FAMVIR) 500 MG tablet  3 times daily     09/09/18 1520           Bethann BerkshireZammit, Aracelis Ulrey, MD 09/12/18 1619

## 2018-10-09 ENCOUNTER — Other Ambulatory Visit: Payer: Self-pay | Admitting: Obstetrics and Gynecology

## 2018-10-09 DIAGNOSIS — Z1231 Encounter for screening mammogram for malignant neoplasm of breast: Secondary | ICD-10-CM

## 2018-10-13 DIAGNOSIS — Z6829 Body mass index (BMI) 29.0-29.9, adult: Secondary | ICD-10-CM | POA: Diagnosis not present

## 2018-10-13 DIAGNOSIS — Z01419 Encounter for gynecological examination (general) (routine) without abnormal findings: Secondary | ICD-10-CM | POA: Diagnosis not present

## 2018-11-15 ENCOUNTER — Ambulatory Visit
Admission: RE | Admit: 2018-11-15 | Discharge: 2018-11-15 | Disposition: A | Discharge status: Home / Self Care | Payer: 59 | Source: Ambulatory Visit | Attending: Obstetrics and Gynecology | Admitting: Obstetrics and Gynecology

## 2018-11-15 DIAGNOSIS — Z1231 Encounter for screening mammogram for malignant neoplasm of breast: Secondary | ICD-10-CM | POA: Diagnosis not present

## 2019-01-08 DIAGNOSIS — E119 Type 2 diabetes mellitus without complications: Secondary | ICD-10-CM | POA: Diagnosis not present

## 2019-01-08 LAB — HM DIABETES EYE EXAM

## 2019-02-21 DIAGNOSIS — H02831 Dermatochalasis of right upper eyelid: Secondary | ICD-10-CM | POA: Diagnosis not present

## 2019-02-21 DIAGNOSIS — H02423 Myogenic ptosis of bilateral eyelids: Secondary | ICD-10-CM | POA: Diagnosis not present

## 2019-02-21 DIAGNOSIS — H0279 Other degenerative disorders of eyelid and periocular area: Secondary | ICD-10-CM | POA: Diagnosis not present

## 2019-02-21 DIAGNOSIS — H02413 Mechanical ptosis of bilateral eyelids: Secondary | ICD-10-CM | POA: Diagnosis not present

## 2019-02-28 DIAGNOSIS — H53483 Generalized contraction of visual field, bilateral: Secondary | ICD-10-CM | POA: Diagnosis not present

## 2019-03-04 ENCOUNTER — Other Ambulatory Visit: Payer: Self-pay | Admitting: Family Medicine

## 2019-03-05 ENCOUNTER — Other Ambulatory Visit: Payer: Self-pay | Admitting: Family Medicine

## 2019-03-07 ENCOUNTER — Other Ambulatory Visit: Payer: Self-pay

## 2019-03-07 ENCOUNTER — Encounter: Payer: Self-pay | Admitting: Family Medicine

## 2019-03-07 ENCOUNTER — Ambulatory Visit: Payer: 59 | Admitting: Family Medicine

## 2019-03-07 VITALS — BP 122/70 | HR 77 | Temp 97.8°F | Ht 64.0 in | Wt 163.1 lb

## 2019-03-07 DIAGNOSIS — I10 Essential (primary) hypertension: Secondary | ICD-10-CM | POA: Diagnosis not present

## 2019-03-07 DIAGNOSIS — Z1159 Encounter for screening for other viral diseases: Secondary | ICD-10-CM

## 2019-03-07 DIAGNOSIS — E119 Type 2 diabetes mellitus without complications: Secondary | ICD-10-CM

## 2019-03-07 DIAGNOSIS — E785 Hyperlipidemia, unspecified: Secondary | ICD-10-CM

## 2019-03-07 LAB — HEPATIC FUNCTION PANEL
ALT: 14 U/L (ref 0–35)
AST: 13 U/L (ref 0–37)
Albumin: 4.7 g/dL (ref 3.5–5.2)
Alkaline Phosphatase: 87 U/L (ref 39–117)
BILIRUBIN DIRECT: 0.1 mg/dL (ref 0.0–0.3)
BILIRUBIN TOTAL: 0.7 mg/dL (ref 0.2–1.2)
TOTAL PROTEIN: 7.3 g/dL (ref 6.0–8.3)

## 2019-03-07 LAB — POCT GLYCOSYLATED HEMOGLOBIN (HGB A1C): Hemoglobin A1C: 7 % — AB (ref 4.0–5.6)

## 2019-03-07 LAB — BASIC METABOLIC PANEL
BUN: 21 mg/dL (ref 6–23)
CHLORIDE: 99 meq/L (ref 96–112)
CO2: 29 meq/L (ref 19–32)
Calcium: 10.1 mg/dL (ref 8.4–10.5)
Creatinine, Ser: 0.93 mg/dL (ref 0.40–1.20)
GFR: 61.01 mL/min (ref >60.00)
GLUCOSE: 176 mg/dL — AB (ref 70–99)
Potassium: 3.9 mEq/L (ref 3.5–5.1)
Sodium: 140 mEq/L (ref 135–145)

## 2019-03-07 LAB — LDL CHOLESTEROL, DIRECT: Direct LDL: 84 mg/dL

## 2019-03-07 LAB — LIPID PANEL
CHOL/HDL RATIO: 4
CHOLESTEROL: 156 mg/dL (ref 0–200)
HDL: 41.4 mg/dL (ref >39.00)
NONHDL: 114.34
Triglycerides: 226 mg/dL — ABNORMAL HIGH (ref 0.0–149.0)
VLDL: 45.2 mg/dL — AB (ref 0.0–40.0)

## 2019-03-07 NOTE — Progress Notes (Signed)
  Subjective:     Patient ID: Amanda Obrien, female   DOB: 1956-07-12, 63 y.o.   MRN: [id number]  HPI Patient is in for medical follow-up.  She has type 2 diabetes, hyperlipidemia, hypertension.  Medications include metformin, Crestor, Micardis HCTZ.  Compliant with therapy.  She has been more diligent watching diet and has lost 3 pounds since last visit.  Her fasting blood sugars still range sometimes up around 150.  Postprandial at night before bedtime around 130.  No polyuria or polydipsia.  Needs follow-up labs.  No history of hepatitis C screening.  Overall low risk other than age demographic.  Past Medical History:  Diagnosis Date  . Chicken pox   . Diabetes mellitus 1998   type ll  . Hepatitis   . Hyperlipidemia   . Hypertension    Past Surgical History:  Procedure Laterality Date  . ABDOMINAL HYSTERECTOMY  1990  . BREAST REDUCTION SURGERY  1996  . CHOLECYSTECTOMY  1990    reports that she has never smoked. She has never used smokeless tobacco. She reports that she does not drink alcohol or use drugs. family history includes Arthritis in her mother; Cancer in her sister; Cancer (age of onset: 45) in her father; Diabetes in her maternal grandfather and mother; Hyperlipidemia in her mother; Hypertension in her mother; Stroke in her maternal grandmother. No Known Allergies   Review of Systems  Constitutional: Negative for appetite change, fatigue and unexpected weight change.  Eyes: Negative for visual disturbance.  Respiratory: Negative for cough, chest tightness, shortness of breath and wheezing.   Cardiovascular: Negative for chest pain, palpitations and leg swelling.  Endocrine: Negative for polydipsia and polyuria.  Neurological: Negative for dizziness, seizures, syncope, weakness, light-headedness and headaches.       Objective:   Physical Exam Constitutional:      Appearance: Normal appearance.  Cardiovascular:     Rate and Rhythm: Normal rate and regular  rhythm.  Pulmonary:     Effort: Pulmonary effort is normal.     Breath sounds: Normal breath sounds.  Musculoskeletal:     Right lower leg: No edema.     Left lower leg: No edema.  Neurological:     Mental Status: She is alert.        Assessment:     #1 type 2 diabetes fairly well controlled with A1c today 7.0%.  Somewhat surprising that this is up from previous 6.7% as she has lost a few pounds and has been more diligent with diet  #2 hypertension stable and at goal  #3 dyslipidemia treated with Crestor    Plan:     -Repeat lipid, hepatic, basic metabolic panel -Continue weight loss efforts.  Try to establish more consistent exercise -Check hepatitis C antibody -Continue with yearly eye exams -We discussed other possible medications to treat her diabetes such as GLP-1 or SGLT2 class but at this point she would like to wait 6 more months and reassess.  This seems reasonable  [name] MD [facility] at [facility]

## 2019-03-08 ENCOUNTER — Encounter: Payer: Self-pay | Admitting: Family Medicine

## 2019-03-08 LAB — HEPATITIS C ANTIBODY
Hepatitis C Ab: NONREACTIVE
SIGNAL TO CUT-OFF: 0 (ref <1.00)

## 2019-03-10 ENCOUNTER — Other Ambulatory Visit: Payer: Self-pay | Admitting: Family Medicine

## 2019-03-26 ENCOUNTER — Other Ambulatory Visit: Payer: Self-pay | Admitting: Family Medicine

## 2019-08-23 ENCOUNTER — Other Ambulatory Visit: Payer: Self-pay | Admitting: Family Medicine

## 2019-09-07 ENCOUNTER — Ambulatory Visit: Payer: 59 | Admitting: Family Medicine

## 2019-09-10 ENCOUNTER — Ambulatory Visit: Payer: 59 | Admitting: Family Medicine

## 2019-09-10 ENCOUNTER — Other Ambulatory Visit: Payer: Self-pay

## 2019-09-10 ENCOUNTER — Encounter: Payer: Self-pay | Admitting: Family Medicine

## 2019-09-10 VITALS — BP 122/74 | HR 87 | Temp 97.6°F | Ht 64.0 in | Wt 166.1 lb

## 2019-09-10 DIAGNOSIS — E119 Type 2 diabetes mellitus without complications: Secondary | ICD-10-CM | POA: Diagnosis not present

## 2019-09-10 DIAGNOSIS — Z23 Encounter for immunization: Secondary | ICD-10-CM | POA: Diagnosis not present

## 2019-09-10 LAB — POCT GLYCOSYLATED HEMOGLOBIN (HGB A1C): Hemoglobin A1C: 7.5 % — AB (ref 4.0–5.6)

## 2019-09-10 NOTE — Patient Instructions (Signed)
Tighten up diet and try to step up exercise  Let's plan on 3 month follow up and we may need to look at additional medication if not closer to goal.

## 2019-09-10 NOTE — Progress Notes (Signed)
  Subjective:     Patient ID: Amanda Obrien, female   DOB: May 22, 1956, 63 y.o.   MRN: [id number]  HPI Here for follow-up type 2 diabetes.  Last A1c 7.0%.  Poor compliance with exercise and diet since follow-up.  She takes metformin 500 mg twice daily.  She had GI issues with higher dosing.  Not monitoring blood sugars regularly.  No significant polyuria.  Her weight is up 3 pounds from last visit.  Past Medical History:  Diagnosis Date  . Chicken pox   . Diabetes mellitus 1998   type ll  . Hepatitis   . Hyperlipidemia   . Hypertension    Past Surgical History:  Procedure Laterality Date  . ABDOMINAL HYSTERECTOMY  1990  . BREAST REDUCTION SURGERY  1996  . CHOLECYSTECTOMY  1990  . eyelid surgery      reports that she has never smoked. She has never used smokeless tobacco. She reports that she does not drink alcohol or use drugs. family history includes Arthritis in her mother; Cancer in her sister; Cancer (age of onset: 66) in her father; Diabetes in her maternal grandfather and mother; Hyperlipidemia in her mother; Hypertension in her mother; Stroke in her maternal grandmother. No Known Allergies   Review of Systems  Constitutional: Negative for fatigue.  Eyes: Negative for visual disturbance.  Respiratory: Negative for cough, chest tightness, shortness of breath and wheezing.   Cardiovascular: Negative for chest pain, palpitations and leg swelling.  Endocrine: Negative for polydipsia and polyuria.  Neurological: Negative for dizziness, seizures, syncope, weakness, light-headedness and headaches.       Objective:   Physical Exam Constitutional:      Appearance: She is well-developed.  Eyes:     Pupils: Pupils are equal, round, and reactive to light.  Neck:     Musculoskeletal: Neck supple.     Thyroid: No thyromegaly.     Vascular: No JVD.  Cardiovascular:     Rate and Rhythm: Normal rate and regular rhythm.     Heart sounds: No gallop.   Pulmonary:     Effort:  Pulmonary effort is normal. No respiratory distress.     Breath sounds: Normal breath sounds. No wheezing or rales.  Neurological:     Mental Status: She is alert.        Assessment:     Type 2 diabetes.  Suboptimal control with A1c today 7.5%    Plan:     -We had a discussion regarding options.  We gave her option of weight loss and increased exercise and dietary compliance and recheck in 3 months versus going ahead and adding additional medication possibly a GLP-1 or SGLT2.  She prefers the former.  We will plan 31-month follow-up.  If not better to goal that point consider GLP-1 medication  -Flu vaccine given  Eulas Post MD [facility] at [facility]

## 2019-09-27 ENCOUNTER — Other Ambulatory Visit: Payer: Self-pay | Admitting: Family Medicine

## 2019-10-15 ENCOUNTER — Other Ambulatory Visit: Payer: Self-pay

## 2019-10-15 DIAGNOSIS — Z20822 Contact with and (suspected) exposure to covid-19: Secondary | ICD-10-CM

## 2019-10-16 LAB — NOVEL CORONAVIRUS, NAA: SARS-CoV-2, NAA: DETECTED — AB

## 2019-10-21 ENCOUNTER — Other Ambulatory Visit: Payer: Self-pay | Admitting: Family Medicine

## 2019-10-31 ENCOUNTER — Other Ambulatory Visit: Payer: Self-pay | Admitting: Obstetrics and Gynecology

## 2019-10-31 DIAGNOSIS — Z1231 Encounter for screening mammogram for malignant neoplasm of breast: Secondary | ICD-10-CM

## 2019-12-11 ENCOUNTER — Other Ambulatory Visit: Payer: Self-pay

## 2019-12-11 ENCOUNTER — Encounter: Payer: Self-pay | Admitting: Family Medicine

## 2019-12-11 ENCOUNTER — Ambulatory Visit: Payer: 59 | Admitting: Family Medicine

## 2019-12-11 VITALS — BP 120/68 | HR 73 | Temp 97.3°F | Ht 64.0 in | Wt 165.6 lb

## 2019-12-11 DIAGNOSIS — E119 Type 2 diabetes mellitus without complications: Secondary | ICD-10-CM | POA: Diagnosis not present

## 2019-12-11 LAB — POCT GLYCOSYLATED HEMOGLOBIN (HGB A1C): Hemoglobin A1C: 7.1 % — AB (ref 4.0–5.6)

## 2019-12-11 NOTE — Progress Notes (Signed)
  Subjective:     Patient ID: Amanda Obrien, female   DOB: 1956-10-15, 63 y.o.   MRN: [id number]  HPI   Amanda Obrien is seen for medical follow-up.  She has type 2 diabetes.  Her last A1c was 7.5%.  She been walking up to 5 miles a day until October when she came in with Covid infection.  She and her husband had Covid and they had fatigue for about 3 weeks but no other major symptoms.  She feels back to baseline at this time.  Not monitoring blood sugars regularly.  She also has hypertension and hyperlipidemia.  Medications reviewed.  Compliant with all.  Her appetite and weight are stable.  No significant polyuria or polydipsia.  She plans to have diabetic eye exam in January.  No neuropathy symptoms.  Past Medical History:  Diagnosis Date  . Chicken pox   . Diabetes mellitus 1998   type ll  . Hepatitis   . Hyperlipidemia   . Hypertension    Past Surgical History:  Procedure Laterality Date  . ABDOMINAL HYSTERECTOMY  1990  . BREAST REDUCTION SURGERY  1996  . CHOLECYSTECTOMY  1990  . eyelid surgery      reports that she has never smoked. She has never used smokeless tobacco. She reports that she does not drink alcohol or use drugs. family history includes Arthritis in her mother; Cancer in her sister; Cancer (age of onset: 6) in her father; Diabetes in her maternal grandfather and mother; Hyperlipidemia in her mother; Hypertension in her mother; Stroke in her maternal grandmother. No Known Allergies   Review of Systems  Constitutional: Negative for appetite change, fatigue and unexpected weight change.  Eyes: Negative for visual disturbance.  Respiratory: Negative for cough, chest tightness, shortness of breath and wheezing.   Cardiovascular: Negative for chest pain, palpitations and leg swelling.  Endocrine: Negative for polydipsia and polyuria.  Neurological: Negative for dizziness, seizures, syncope, weakness, light-headedness and headaches.       Objective:   Physical  Exam Constitutional:      Appearance: She is well-developed.  Eyes:     Pupils: Pupils are equal, round, and reactive to light.  Neck:     Thyroid: No thyromegaly.     Vascular: No JVD.  Cardiovascular:     Rate and Rhythm: Normal rate and regular rhythm.     Heart sounds: No gallop.   Pulmonary:     Effort: Pulmonary effort is normal. No respiratory distress.     Breath sounds: Normal breath sounds. No wheezing or rales.  Musculoskeletal:     Cervical back: Neck supple.  Skin:    Comments: Feet reveal no skin lesions. Good distal foot pulses. Good capillary refill. No calluses. Normal sensation with monofilament testing   Neurological:     Mental Status: She is alert.        Assessment:     #1 type 2 diabetes somewhat improved with A1c 7.1%  #2 hypertension stable and at goal  #3 dyslipidemia treated with Crestor    Plan:     -We discussed options of either additional medication such as SGLT2 or GLP-1 medication versus continued lifestyle modification in 53-month follow-up and she prefers the latter -Hopefully she can back to regular walking soon -Offered Pneumovax but she declines -Recommend 17-month follow-up  Eulas Post MD [facility] at [facility]

## 2019-12-26 ENCOUNTER — Ambulatory Visit
Admission: RE | Admit: 2019-12-26 | Discharge: 2019-12-26 | Disposition: A | Discharge status: Home / Self Care | Payer: 59 | Source: Ambulatory Visit | Attending: Obstetrics and Gynecology | Admitting: Obstetrics and Gynecology

## 2019-12-26 ENCOUNTER — Other Ambulatory Visit: Payer: Self-pay

## 2019-12-26 DIAGNOSIS — Z1231 Encounter for screening mammogram for malignant neoplasm of breast: Secondary | ICD-10-CM

## 2020-01-04 ENCOUNTER — Other Ambulatory Visit: Payer: Self-pay | Admitting: Obstetrics and Gynecology

## 2020-01-04 DIAGNOSIS — E2839 Other primary ovarian failure: Secondary | ICD-10-CM

## 2020-01-21 LAB — HM DIABETES EYE EXAM

## 2020-01-23 ENCOUNTER — Other Ambulatory Visit: Payer: Self-pay | Admitting: Family Medicine

## 2020-02-18 ENCOUNTER — Other Ambulatory Visit: Payer: Self-pay | Admitting: Family Medicine

## 2020-02-19 ENCOUNTER — Other Ambulatory Visit: Payer: Self-pay | Admitting: Family Medicine

## 2020-03-06 ENCOUNTER — Ambulatory Visit
Admission: RE | Admit: 2020-03-06 | Discharge: 2020-03-06 | Disposition: A | Discharge status: Home / Self Care | Payer: 59 | Source: Ambulatory Visit | Attending: Obstetrics and Gynecology | Admitting: Obstetrics and Gynecology

## 2020-03-06 ENCOUNTER — Other Ambulatory Visit: Payer: Self-pay

## 2020-03-06 DIAGNOSIS — E2839 Other primary ovarian failure: Secondary | ICD-10-CM

## 2020-03-10 ENCOUNTER — Encounter: Payer: Self-pay | Admitting: Family Medicine

## 2020-03-10 ENCOUNTER — Other Ambulatory Visit: Payer: Self-pay

## 2020-03-10 ENCOUNTER — Ambulatory Visit (INDEPENDENT_AMBULATORY_CARE_PROVIDER_SITE_OTHER): Payer: 59 | Admitting: Family Medicine

## 2020-03-10 VITALS — BP 116/62 | HR 82 | Temp 97.6°F | Wt 166.7 lb

## 2020-03-10 DIAGNOSIS — E785 Hyperlipidemia, unspecified: Secondary | ICD-10-CM

## 2020-03-10 DIAGNOSIS — I10 Essential (primary) hypertension: Secondary | ICD-10-CM

## 2020-03-10 DIAGNOSIS — E119 Type 2 diabetes mellitus without complications: Secondary | ICD-10-CM

## 2020-03-10 LAB — HEPATIC FUNCTION PANEL
ALT: 14 U/L (ref 0–35)
AST: 14 U/L (ref 0–37)
Albumin: 4.2 g/dL (ref 3.5–5.2)
Alkaline Phosphatase: 80 U/L (ref 39–117)
Bilirubin, Direct: 0.1 mg/dL (ref 0.0–0.3)
Total Bilirubin: 0.8 mg/dL (ref 0.2–1.2)
Total Protein: 6.7 g/dL (ref 6.0–8.3)

## 2020-03-10 LAB — LIPID PANEL
Cholesterol: 133 mg/dL (ref 0–200)
HDL: 36.9 mg/dL — ABNORMAL LOW (ref >39.00)
LDL Cholesterol: 57 mg/dL (ref 0–99)
NonHDL: 96.21
Total CHOL/HDL Ratio: 4
Triglycerides: 197 mg/dL — ABNORMAL HIGH (ref 0.0–149.0)
VLDL: 39.4 mg/dL (ref 0.0–40.0)

## 2020-03-10 LAB — BASIC METABOLIC PANEL
BUN: 14 mg/dL (ref 6–23)
CO2: 30 mEq/L (ref 19–32)
Calcium: 9.8 mg/dL (ref 8.4–10.5)
Chloride: 100 mEq/L (ref 96–112)
Creatinine, Ser: 0.88 mg/dL (ref 0.40–1.20)
GFR: 64.81 mL/min (ref >60.00)
Glucose, Bld: 169 mg/dL — ABNORMAL HIGH (ref 70–99)
Potassium: 3.9 mEq/L (ref 3.5–5.1)
Sodium: 139 mEq/L (ref 135–145)

## 2020-03-10 LAB — POCT GLYCOSYLATED HEMOGLOBIN (HGB A1C): Hemoglobin A1C: 7.6 % — AB (ref 4.0–5.6)

## 2020-03-10 MED ORDER — JARDIANCE 10 MG PO TABS: 10.0000 mg | ORAL_TABLET | Freq: Every day | ORAL | 3 refills | Status: AC

## 2020-03-10 NOTE — Progress Notes (Signed)
  Subjective:     Patient ID: Amanda Obrien, female   DOB: [birth year], 64 y.o.   MRN: [id number]  HPI   Amanda Obrien seen for medical follow-up.  She and her husband are going to be moving to [facility] soon.  They have a condominium there and plan to build there eventually and retire to that region.  She has not yet established care there.  She may do a couple virtual follow-ups here.  She has not been monitoring blood sugar regularly.  She has been treated with Metformin only for the past several years.  Her last A1c was 7.1%.  Denies any polyuria or polydipsia.  She had recent DEXA scan through GYN.  She asks that we review that.  She had osteopenia.  Her T score is -1.6.  She takes calcium and vitamin D.  No history of fractures.  FRAX score was below threshold to recommend medical therapy  She has hyperlipidemia which is treated with rosuvastatin.  She is due for follow-up labs.  Her blood pressures been stable.  No dizziness.  No chest pains.  Past Medical History:  Diagnosis Date  . Chicken pox   . Diabetes mellitus 1998   type ll  . Hepatitis   . Hyperlipidemia   . Hypertension    Past Surgical History:  Procedure Laterality Date  . ABDOMINAL HYSTERECTOMY  1990  . BREAST REDUCTION SURGERY  1996  . CHOLECYSTECTOMY  1990  . eyelid surgery      reports that she has never smoked. She has never used smokeless tobacco. She reports that she does not drink alcohol or use drugs. family history includes Arthritis in her mother; Cancer in her sister; Cancer (age of onset: 49) in her father; Diabetes in her maternal grandfather and mother; Hyperlipidemia in her mother; Hypertension in her mother; Stroke in her maternal grandmother. No Known Allergies   Review of Systems  Constitutional: Negative for fatigue and unexpected weight change.  Eyes: Negative for visual disturbance.  Respiratory: Negative for cough, chest tightness, shortness of breath and wheezing.   Cardiovascular:  Negative for chest pain, palpitations and leg swelling.  Endocrine: Negative for polydipsia and polyuria.  Neurological: Negative for dizziness, seizures, syncope, weakness, light-headedness and headaches.       Objective:   Physical Exam Vitals reviewed.  Constitutional:      Appearance: Normal appearance.  Cardiovascular:     Rate and Rhythm: Normal rate and regular rhythm.  Pulmonary:     Effort: Pulmonary effort is normal.     Breath sounds: Normal breath sounds.  Musculoskeletal:     Right lower leg: No edema.     Left lower leg: No edema.  Neurological:     Mental Status: She is alert.        Assessment:     #1 type 2 diabetes suboptimal control with A1c 7.6%  #2 hypertension stable and at goal  #3 dyslipidemia treated with rosuvastatin and due for labs  #4 osteopenia by recent DEXA scan.  DEXA scan results reviewed with patient    Plan:     -Check lipid, hepatic, basic metabolic panel -We discussed starting Jardiance 10 mg 1 daily.  We reviewed potential side effects.  She has no contraindications.  She will need follow-up in 3 months either here or establishing with new provider to reassess.  [name] MD [facility] at [facility]

## 2020-03-10 NOTE — Patient Instructions (Signed)
Osteopenia  Osteopenia is a loss of thickness (density) inside of the bones. Another name for osteopenia is low bone mass. Mild osteopenia is a normal part of aging. It is not a disease, and it does not cause symptoms. However, if you have osteopenia and continue to lose bone mass, you could develop a condition that causes the bones to become thin and break more easily (osteoporosis). You may also lose some height, have back pain, and have a stooped posture. Although osteopenia is not a disease, making changes to your lifestyle and diet can help to prevent osteopenia from developing into osteoporosis. What are the causes? Osteopenia is caused by loss of calcium in the bones.  Bones are constantly changing. Old bone cells are continually being replaced with new bone cells. This process builds new bone. The mineral calcium is needed to build new bone and maintain bone density. Bone density is usually highest around age 35. After that, most people's bodies cannot replace all the bone they have lost with new bone. What increases the risk? You are more likely to develop this condition if:  You are older than age 50.  You are a woman who went through menopause early.  You have a long illness that keeps you in bed.  You do not get enough exercise.  You lack certain nutrients (malnutrition).  You have an overactive thyroid gland (hyperthyroidism).  You smoke.  You drink a lot of alcohol.  You are taking medicines that weaken the bones, such as steroids. What are the signs or symptoms? This condition does not cause any symptoms. You may have a slightly higher risk for bone breaks (fractures), so getting fractures more easily than normal may be an indication of osteopenia. How is this diagnosed? Your health care provider can diagnose this condition with a special type of X-ray exam that measures bone density (dual-energy X-ray absorptiometry, DEXA). This test can measure bone density in your  hips, spine, and wrists. Osteopenia has no symptoms, so this condition is usually diagnosed after a routine bone density screening test is done for osteoporosis. This routine screening is usually done for:  Women who are age 65 or older.  Men who are age 70 or older. If you have risk factors for osteopenia, you may have the screening test at an earlier age. How is this treated? Making dietary and lifestyle changes can lower your risk for osteoporosis. If you have severe osteopenia that is close to becoming osteoporosis, your health care provider may prescribe medicines and dietary supplements such as calcium and vitamin D. These supplements help to rebuild bone density. Follow these instructions at home:   Take over-the-counter and prescription medicines only as told by your health care provider. These include vitamins and supplements.  Eat a diet that is high in calcium and vitamin D. ? Calcium is found in dairy products, beans, salmon, and leafy green vegetables like spinach and broccoli. ? Look for foods that have vitamin D and calcium added to them (fortified foods), such as orange juice, cereal, and bread.  Do 30 or more minutes of a weight-bearing exercise every day, such as walking, jogging, or playing a sport. These types of exercises strengthen the bones.  Take precautions at home to lower your risk of falling, such as: ? Keeping rooms well-lit and free of clutter, such as cords. ? Installing safety rails on stairs. ? Using rubber mats in the bathroom or other areas that are often wet or slippery.  Do not use   any products that contain nicotine or tobacco, such as cigarettes and e-cigarettes. If you need help quitting, ask your health care provider.  Avoid alcohol or limit alcohol intake to no more than 1 drink a day for nonpregnant women and 2 drinks a day for men. One drink equals 12 oz of beer, 5 oz of wine, or 1 oz of hard liquor.  Keep all follow-up visits as told by your  health care provider. This is important. Contact a health care provider if:  You have not had a bone density screening for osteoporosis and you are: ? A woman, age 65 or older. ? A man, age 70 or older.  You are a postmenopausal woman who has not had a bone density screening for osteoporosis.  You are older than age 50 and you want to know if you should have bone density screening for osteoporosis. Summary  Osteopenia is a loss of thickness (density) inside of the bones. Another name for osteopenia is low bone mass.  Osteopenia is not a disease, but it may increase your risk for a condition that causes the bones to become thin and break more easily (osteoporosis).  You may be at risk for osteopenia if you are older than age 50 or if you are a woman who went through early menopause.  Osteopenia does not cause any symptoms, but it can be diagnosed with a bone density screening test.  Dietary and lifestyle changes are the first treatment for osteopenia. These may lower your risk for osteoporosis. This information is not intended to replace advice given to you by your health care provider. Make sure you discuss any questions you have with your health care provider. Document Revised: 11/18/2017 Document Reviewed: 09/14/2017 Elsevier Patient Education  2020 [employer]  

## 2020-03-24 ENCOUNTER — Other Ambulatory Visit: Payer: Self-pay | Admitting: Family Medicine

## 2020-04-07 ENCOUNTER — Telehealth: Payer: Self-pay | Admitting: Family Medicine

## 2020-04-07 NOTE — Telephone Encounter (Signed)
Pt states the medication Jardiance has given her 2 yeast infections and knows that it is a side effect of it. She is asking if Dr. Caryl Never can prescribed something to counteract it? Or what she can do to help prevent it?   Pharmacy: [facility] [address] FAX: [phone]   Pt can be reached at [phone]

## 2020-04-07 NOTE — Telephone Encounter (Signed)
Please advise 

## 2020-04-08 NOTE — Telephone Encounter (Signed)
Pt has been given information does not want to switch to trulicity

## 2020-04-08 NOTE — Telephone Encounter (Signed)
Unfortunately, if she is getting these regularly not sure we can really do anything to counter them.  Unless she wants to give this more time, would stop the Jardiance and consider Trulicity 0.75 mg [place] once weekly.

## 2020-04-28 ENCOUNTER — Other Ambulatory Visit: Payer: Self-pay | Admitting: Family Medicine

## 2020-09-28 ENCOUNTER — Other Ambulatory Visit: Payer: Self-pay | Admitting: Family Medicine

## 2021-11-27 IMAGING — MG DIGITAL SCREENING BILAT W/ TOMO W/ CAD
8 series · 8 of 24 positions shown · non-contrast
Comparison: Previous exam(s).

CLINICAL DATA: Screening.

EXAM:
DIGITAL SCREENING BILATERAL MAMMOGRAM WITH TOMO AND CAD

[R CC synth-2D]
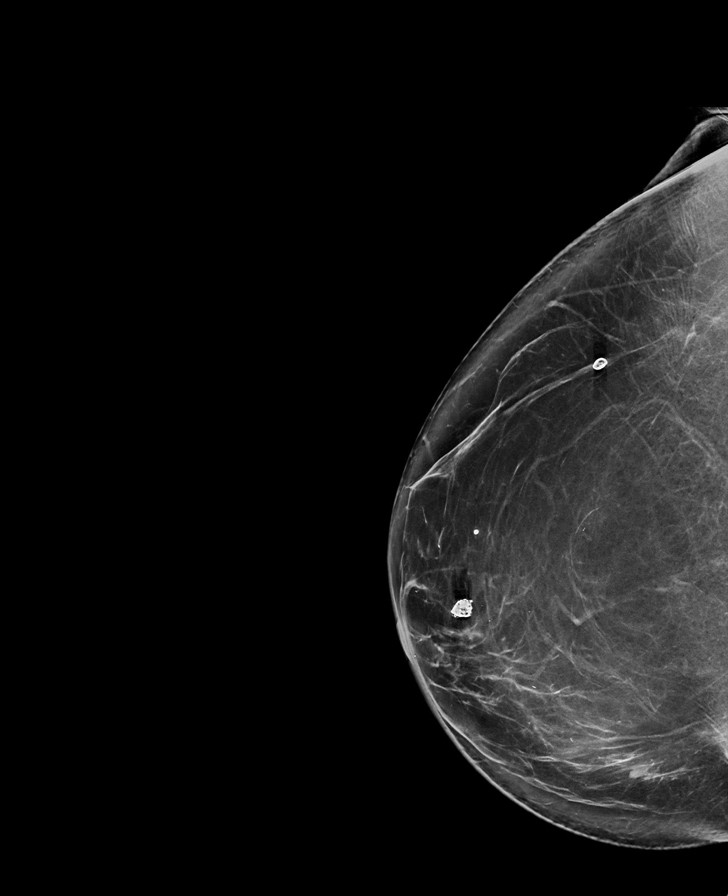

[R MLO synth-2D]
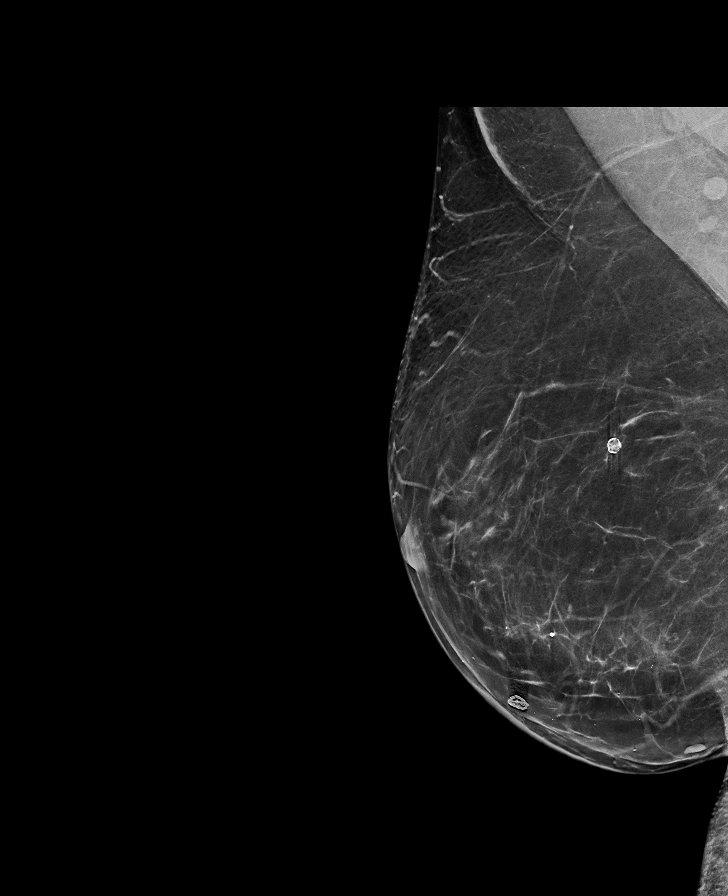

[L MLO synth-2D]
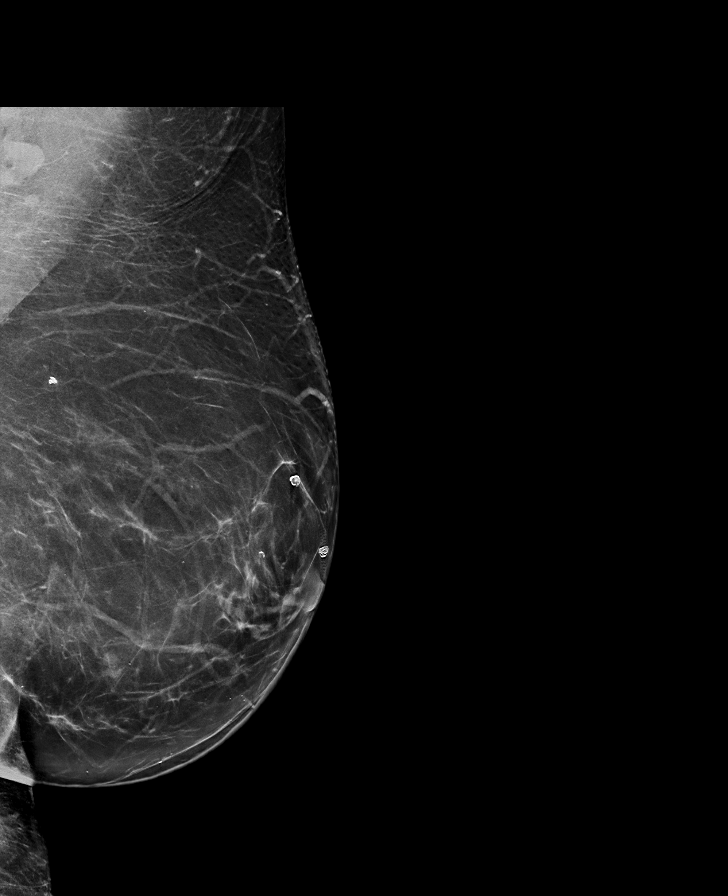

[L CC synth-2D]
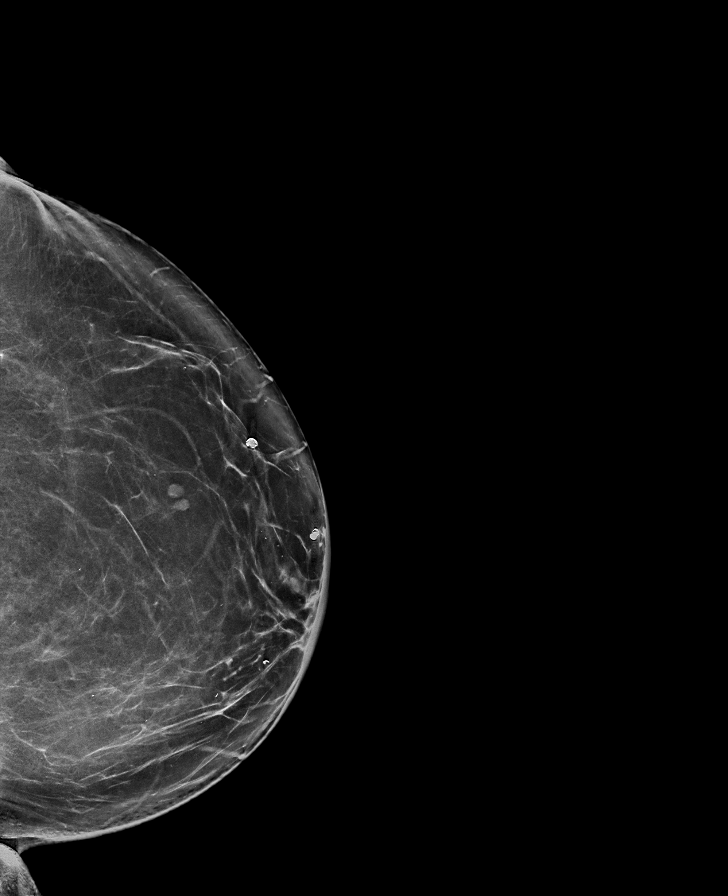

[L MLO tomo · tomo slice 43/85.0]
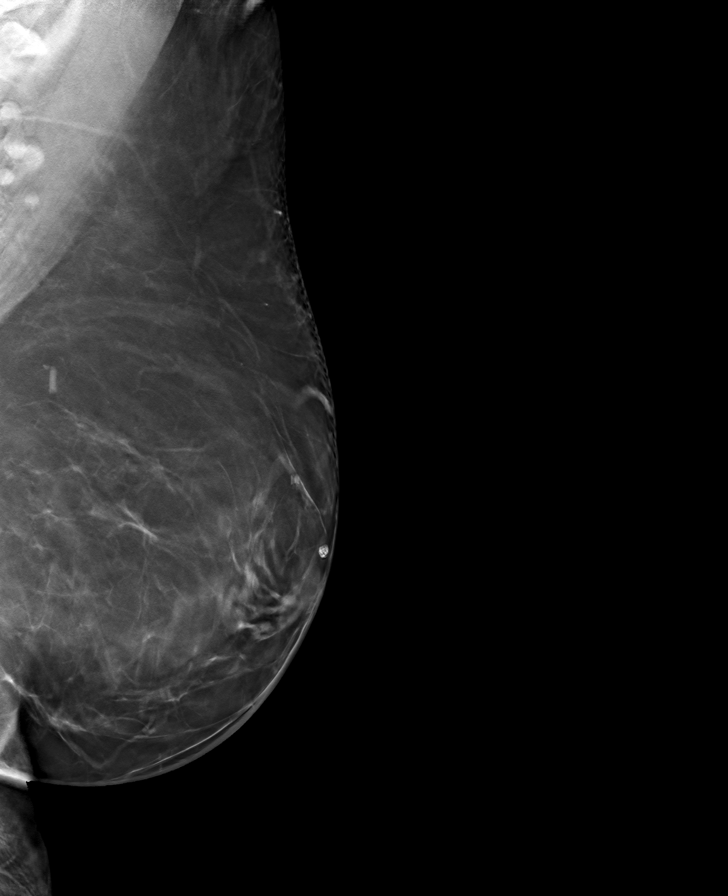

[R CC tomo · tomo slice 44/87.0]
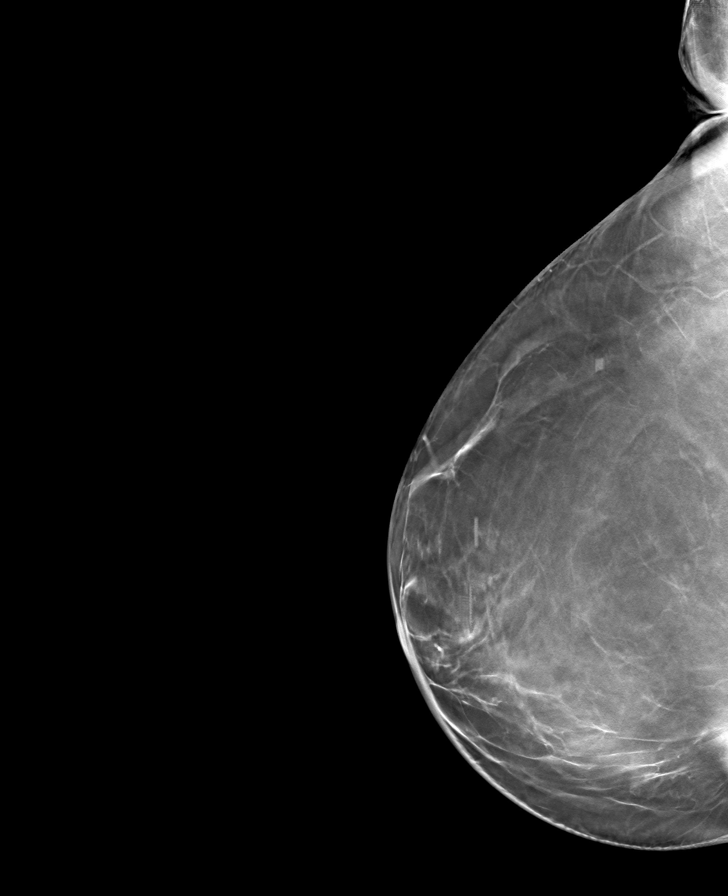

[R MLO tomo · tomo slice 42/83.0]
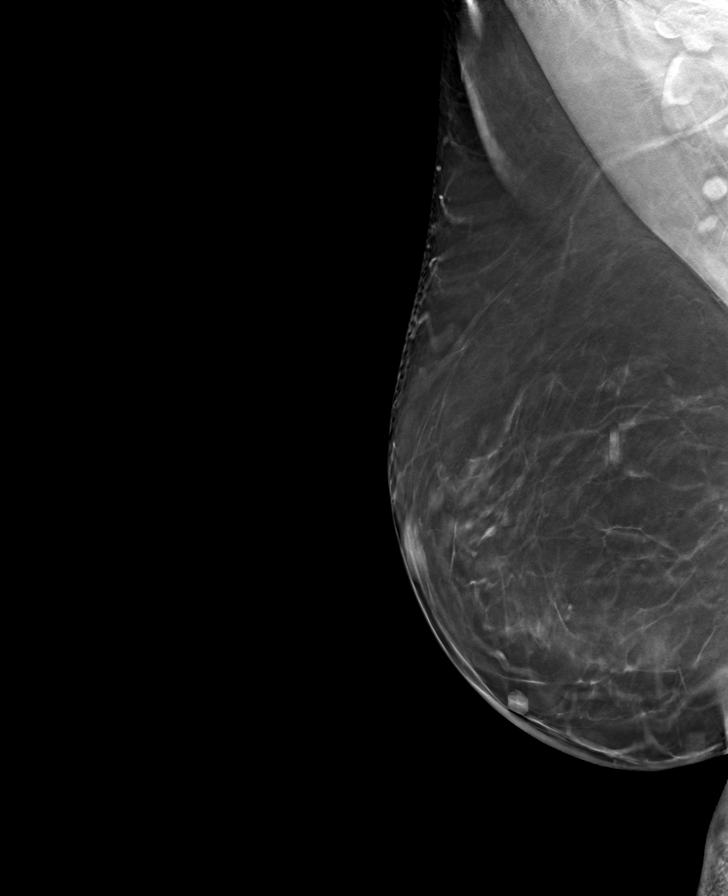

[L CC tomo · tomo slice 43/85.0]
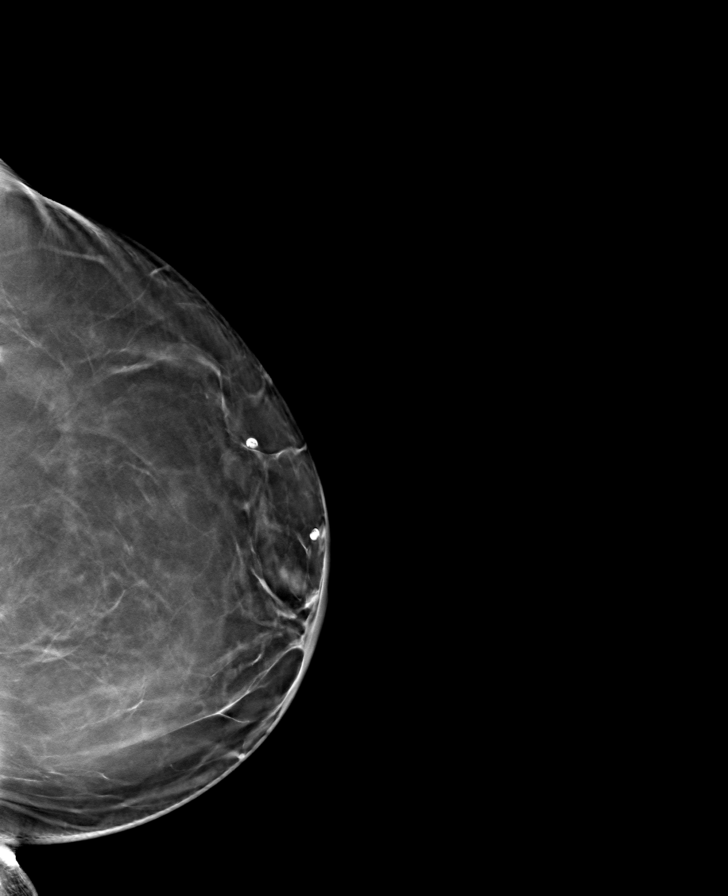

[8 of 24 positions shown; findings below may reference images not displayed]

ACR Breast Density Category b: There are scattered areas of
fibroglandular density.
FINDINGS: There are no findings suspicious for malignancy. Images were
processed with CAD.
IMPRESSION: No mammographic evidence of malignancy. A result letter of this
screening mammogram will be mailed directly to the patient.

RECOMMENDATION:
Screening mammogram in one year. (Code:[id number])

BI-RADS CATEGORY  1: Negative.

## 2022-03-05 ENCOUNTER — Telehealth: Payer: Self-pay | Admitting: Family Medicine

## 2022-03-05 NOTE — Telephone Encounter (Signed)
Lvm for patient to schedule an appt since she haven't been seen since 08/21 ?

## 2022-06-17 ENCOUNTER — Encounter: Payer: Self-pay | Admitting: Internal Medicine
